# Patient Record
Sex: Female | Born: 1975 | Race: White | Hispanic: No | Marital: Married | Smoking: Current every day smoker
Health system: Southern US, Community
[De-identification: ages and names within clinical notes are randomized; demographics above are authoritative.]

## PROBLEM LIST (undated history)

## (undated) DIAGNOSIS — F32A Depression, unspecified: Secondary | ICD-10-CM

## (undated) DIAGNOSIS — R6882 Decreased libido: Secondary | ICD-10-CM

## (undated) DIAGNOSIS — IMO0002 Reserved for concepts with insufficient information to code with codable children: Secondary | ICD-10-CM

## (undated) DIAGNOSIS — E039 Hypothyroidism, unspecified: Secondary | ICD-10-CM

## (undated) DIAGNOSIS — N200 Calculus of kidney: Secondary | ICD-10-CM

## (undated) DIAGNOSIS — M549 Dorsalgia, unspecified: Secondary | ICD-10-CM

## (undated) DIAGNOSIS — I219 Acute myocardial infarction, unspecified: Secondary | ICD-10-CM

## (undated) DIAGNOSIS — E079 Disorder of thyroid, unspecified: Secondary | ICD-10-CM

## (undated) DIAGNOSIS — N920 Excessive and frequent menstruation with regular cycle: Secondary | ICD-10-CM

## (undated) DIAGNOSIS — O00109 Unspecified tubal pregnancy without intrauterine pregnancy: Secondary | ICD-10-CM

## (undated) DIAGNOSIS — F419 Anxiety disorder, unspecified: Secondary | ICD-10-CM

## (undated) DIAGNOSIS — G8929 Other chronic pain: Secondary | ICD-10-CM

## (undated) DIAGNOSIS — F329 Major depressive disorder, single episode, unspecified: Secondary | ICD-10-CM

## (undated) HISTORY — DX: Unspecified tubal pregnancy without intrauterine pregnancy: O00.109

## (undated) HISTORY — DX: Reserved for concepts with insufficient information to code with codable children: IMO0002

## (undated) HISTORY — PX: ECTOPIC PREGNANCY SURGERY: SHX613

## (undated) HISTORY — DX: Excessive and frequent menstruation with regular cycle: N92.0

## (undated) HISTORY — DX: Calculus of kidney: N20.0

## (undated) HISTORY — DX: Depression, unspecified: F32.A

## (undated) HISTORY — DX: Decreased libido: R68.82

## (undated) HISTORY — DX: Disorder of thyroid, unspecified: E07.9

## (undated) HISTORY — DX: Hypothyroidism, unspecified: E03.9

## (undated) HISTORY — DX: Dorsalgia, unspecified: M54.9

## (undated) HISTORY — DX: Other chronic pain: G89.29

## (undated) HISTORY — DX: Major depressive disorder, single episode, unspecified: F32.9

## (undated) HISTORY — PX: BACK SURGERY: SHX140

---

## 2000-08-05 ENCOUNTER — Other Ambulatory Visit: Admission: RE | Admit: 2000-08-05 | Discharge: 2000-08-05 | Payer: Self-pay | Admitting: *Deleted

## 2000-12-03 ENCOUNTER — Encounter: Payer: Self-pay | Admitting: Family Medicine

## 2000-12-03 ENCOUNTER — Ambulatory Visit (HOSPITAL_COMMUNITY): Admission: RE | Admit: 2000-12-03 | Discharge: 2000-12-03 | Payer: Self-pay | Admitting: Family Medicine

## 2002-01-20 HISTORY — PX: CHOLECYSTECTOMY: SHX55

## 2004-02-12 ENCOUNTER — Ambulatory Visit (HOSPITAL_COMMUNITY): Admission: RE | Admit: 2004-02-12 | Discharge: 2004-02-12 | Payer: Self-pay | Admitting: *Deleted

## 2005-08-21 ENCOUNTER — Ambulatory Visit (HOSPITAL_COMMUNITY): Admission: RE | Admit: 2005-08-21 | Discharge: 2005-08-21 | Payer: Self-pay | Admitting: Family Medicine

## 2006-02-08 ENCOUNTER — Emergency Department (HOSPITAL_COMMUNITY): Admission: EM | Admit: 2006-02-08 | Discharge: 2006-02-08 | Payer: Self-pay | Admitting: Emergency Medicine

## 2007-02-11 ENCOUNTER — Ambulatory Visit (HOSPITAL_COMMUNITY): Admission: RE | Admit: 2007-02-11 | Discharge: 2007-02-11 | Payer: Self-pay | Admitting: General Surgery

## 2007-02-22 ENCOUNTER — Encounter (INDEPENDENT_AMBULATORY_CARE_PROVIDER_SITE_OTHER): Payer: Self-pay | Admitting: General Surgery

## 2007-02-22 ENCOUNTER — Ambulatory Visit (HOSPITAL_COMMUNITY): Admission: RE | Admit: 2007-02-22 | Discharge: 2007-02-22 | Payer: Self-pay | Admitting: General Surgery

## 2007-12-21 ENCOUNTER — Emergency Department (HOSPITAL_COMMUNITY): Admission: EM | Admit: 2007-12-21 | Discharge: 2007-12-22 | Payer: Self-pay | Admitting: Emergency Medicine

## 2007-12-28 ENCOUNTER — Ambulatory Visit (HOSPITAL_COMMUNITY): Admission: RE | Admit: 2007-12-28 | Discharge: 2007-12-28 | Payer: Self-pay | Admitting: Family Medicine

## 2008-01-06 ENCOUNTER — Encounter: Admission: RE | Admit: 2008-01-06 | Discharge: 2008-01-06 | Payer: Self-pay | Admitting: Family Medicine

## 2008-01-20 ENCOUNTER — Encounter: Admission: RE | Admit: 2008-01-20 | Discharge: 2008-01-20 | Payer: Self-pay | Admitting: Family Medicine

## 2008-10-31 ENCOUNTER — Emergency Department (HOSPITAL_COMMUNITY): Admission: EM | Admit: 2008-10-31 | Discharge: 2008-10-31 | Payer: Self-pay | Admitting: Emergency Medicine

## 2009-12-15 IMAGING — CT CT L SPINE W/ CM
1 of 8 series · 3 of 14 positions shown, 4 images · IV contrast (omnipaque)
Comparison: None available.

CLINICAL DATA: 31-year-old female with a palpable mass or area of concern involving her left lower back.  Left paraspinal mass.    
LUMBAR SPINE CT WITH CONTRAST:
TECHNIQUE: Multidetector CT imaging of the lumbar spine was performed following intravenous contrast administration.  Multiplanar CT image reconstructions were also generated.
Contrast:  100 ml Omnipaque 300.

[Series 2: lumbar spine 3.0 b30s · axial · 0.33mm/px · z∈[-342,-36]mm · 3 of 103 slices shown, 4 images]
[im 1/103  soft-tissue]
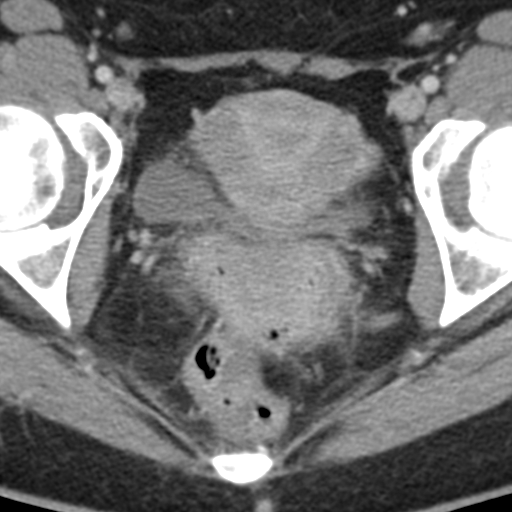
[im 1/103  bone]
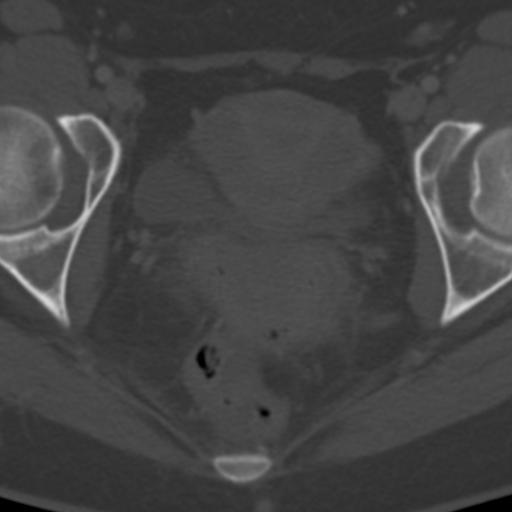
[im 52/103  bone]
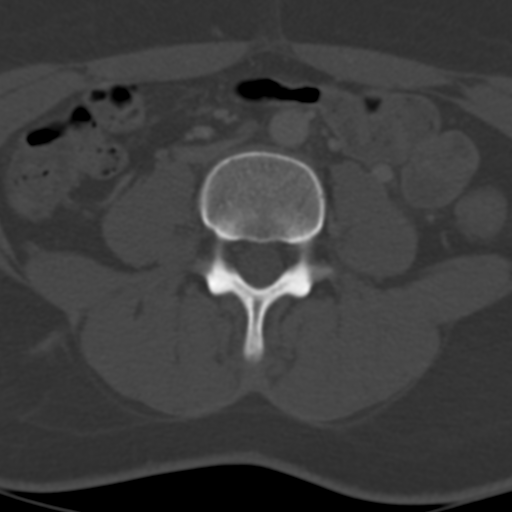
[im 103/103  bone]
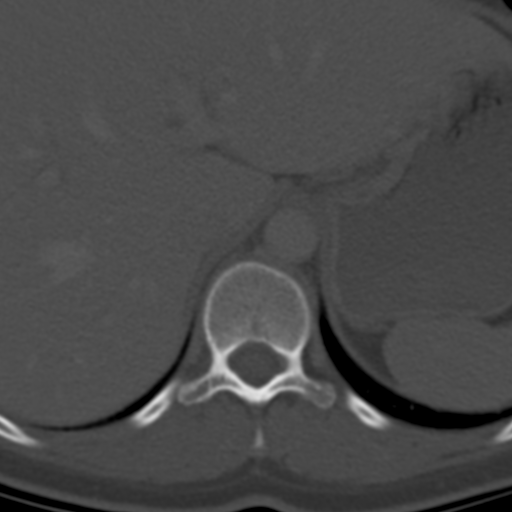

[3 of 14 positions shown; findings below may reference images not displayed]

FINDINGS: There is transitional anatomy with partial lumbarization of S1 and vestigial S1-S2 disc space suspected.  For the purposes of this report, a numbering scheme to reflect this will be utilized.  
The visualized retroperitoneal and abdominal viscera are within normal limits.  There is a circumscribed 10.5 mm cystic structure associated with the right adnexa compatible with physiologic cyst in this age.  No ascites or free fluid is seen. The gallbladder is surgically absent.  No abdominal or pelvic lymphadenopathy is identified. 
There is normal bone mineralization throughout the visualized spine and pelvis.  There is normal vertebral body height and alignment.  
T11-T12:  Normal. 
T12-L1:  Normal. 
L1-L2:  Normal.
L2-L3:  Normal. 
L3-L4:  Normal. 
L4-L5:  Slight disc bulge.  Mild ligament flavum hypertrophy, otherwise normal.  
L5-S1:  Central disc protrusion.  No spinal stenosis (AP thecal sac measures 12 mm).  Mild bilateral neural foraminal stenosis related to combined ligament flavum and disc bulge.  
The visualized paraspinal muscles are unremarkable.  Visualized gluteal and pelvic muscles are unremarkable.  There is a homogeneous fat attenuation mass located just superficial to the posterior left twelfth rib measuring 3.5 X 1.6 X 2.1 cm (transverse X AP X CC).  This appears encapsulated by a small layer of fascia and is most compatible with a simple lipoma.  Otherwise, the visualized skin and subcutaneous soft tissues of the back are unremarkable.
IMPRESSION: 1.  Palpable abnormality may correspond to a lipoma superficial to the posterior twelfth rib measuring 3.5 X 1.6 X 2.1 cm.  
2.  Mild L4-L5 and L5-S1 disc degeneration.

## 2010-04-10 ENCOUNTER — Emergency Department (HOSPITAL_COMMUNITY)
Admission: EM | Admit: 2010-04-10 | Discharge: 2010-04-10 | Disposition: A | Discharge status: Home / Self Care | Payer: Self-pay | Attending: Emergency Medicine | Admitting: Emergency Medicine

## 2010-04-10 ENCOUNTER — Emergency Department (HOSPITAL_COMMUNITY): Payer: Self-pay

## 2010-04-10 DIAGNOSIS — R109 Unspecified abdominal pain: Secondary | ICD-10-CM | POA: Insufficient documentation

## 2010-04-10 DIAGNOSIS — N200 Calculus of kidney: Secondary | ICD-10-CM | POA: Insufficient documentation

## 2010-04-10 DIAGNOSIS — R300 Dysuria: Secondary | ICD-10-CM | POA: Insufficient documentation

## 2010-04-10 LAB — URINALYSIS, ROUTINE W REFLEX MICROSCOPIC
Bilirubin Urine: NEGATIVE
Ketones, ur: NEGATIVE mg/dL
Specific Gravity, Urine: >1.03 — ABNORMAL HIGH (ref 1.005–1.030)
pH: 5 (ref 5.0–8.0)

## 2010-04-10 LAB — PREGNANCY, URINE: Preg Test, Ur: NEGATIVE

## 2010-04-10 LAB — URINE MICROSCOPIC-ADD ON

## 2010-04-15 ENCOUNTER — Emergency Department (HOSPITAL_COMMUNITY)
Admission: EM | Admit: 2010-04-15 | Discharge: 2010-04-16 | Disposition: A | Discharge status: Home / Self Care | Payer: Self-pay | Attending: Emergency Medicine | Admitting: Emergency Medicine

## 2010-04-15 ENCOUNTER — Emergency Department (HOSPITAL_COMMUNITY): Payer: Self-pay

## 2010-04-15 DIAGNOSIS — R079 Chest pain, unspecified: Secondary | ICD-10-CM | POA: Insufficient documentation

## 2010-04-25 LAB — URINALYSIS, ROUTINE W REFLEX MICROSCOPIC
Ketones, ur: NEGATIVE mg/dL
Leukocytes, UA: NEGATIVE
Nitrite: NEGATIVE
pH: 5.5 (ref 5.0–8.0)

## 2010-04-25 LAB — URINE MICROSCOPIC-ADD ON

## 2010-06-04 NOTE — Op Note (Signed)
NAME:  Sheri Vargas, Sheri Vargas                 ACCOUNT NO.:  [id number]   MEDICAL RECORD NO.:  [id number]          PATIENT TYPE:  AMB   LOCATION:  DAY                           FACILITY:  [facility]   PHYSICIAN:  Tilford Pillar, MD      DATE OF BIRTH:  12/09/1975   DATE OF PROCEDURE:  02/22/2007  DATE OF DISCHARGE:                               OPERATIVE REPORT   PREOPERATIVE DIAGNOSIS:  Lipomas of the lumbar region.   POSTPROCEDURE DIAGNOSIS:  Lipomas of the lumbar region.   PROCEDURE:  Excision of lipoma x3.   SURGEON:  Tilford Pillar, MD   ANESTHESIA:  General, local anesthetic with 1% Sensorcaine plain.   SPECIMENS:  Lipomatous masses x3.   ESTIMATED BLOOD LOSS:  Minimal.   INDICATIONS:  The patient is a 35 year old female who presented to my  office with complaints of discomfort in her lower back.  She also  complains of radicular pain and chronic lower back pain.  Also states  that she has discomfort in the area where she can feel the separate  small nodules.  A CT was obtained to ensure that no extension from the  spinal cord was noted as two of these were in the lower lumbar region.  It was discussed with the patient that these were likely benign lipomas.  The risks, benefits, and alternatives of excision were discussed at  length with the patient as well as discussion regarding likely referral  for her radicular pain as this is not likely to be the etiology of her  radiculopathy.  The patient's questions and concerns were addressed.  The patient was consent for the planned procedure.   OPERATION:  The patient was taken to the operating room and placed in  the supine position, at which time a general anesthetic was administered  and once the patient was asleep, she was intubated and then placed in a  right lateral decubitus position using a beanbag device to maintain  positioning.  After ensuring that all pressure points were supported  with soft cushioning, the patient's back was  prepped and draped in the  usual fashion and a marking pen was utilized to mark the planned areas  of incision.  There was one in the superior lumbar region near the T12-  L1 area just palpable below the twelfth rib as well as two in the lower  lumbar region just cephalad to the sacrum.  Two separate vertical  incisions were created.  The superior incision was created first.  Dissection was carried down to the subcuticular tissue using needle-  point electrocautery.  An approximate 1.5-cm lipomatous mass was  identified.  This was excised.  This was not very well-encapsulated and  was multilobulated.  Upon removal, it was placed on the back table and  sent as a permanent specimen.  Hemostasis was obtained.  Palpation of  the upper back at this point did not demonstrate any additional  lipomatous lesions.  A Ray-Tec sponge was placed into the wound and  attention was turned to the lower incision.  Again a vertical skin  incision  was carried out with a scalpel.  Additional dissection down to  the subcuticular was carried out using electrocautery and the two small  multilobulated lipomatous masses were identified.  These were both  approximately 1 cm in length.  These were again removed and were placed  on the back table and sent as a permanent specimen.  The second one of  these was relatively deep, just anterior to the paraspinal fascia.  Both  lesions were clearly within the subcuticular tissue.  No muscle was  divided or entered into.  At this point hemostasis was excellent.  Using  a 3-0 Vicryl, the deep subcuticular tissue was reapproximated at both  incisions in a simple interrupted fashion and a 4-0 Monocryl was  utilized in a running subcuticular tissue to reapproximate the skin  edges of both incisions.  A local anesthetic was instilled and the skin  was washed and dried with a moistened dry towel and Benzoin was applied  around the incision and quarter-inch Steri-Strips were placed  over both  incisions.  The drapes were removed.  The patient was placed back into a  supine position and was allowed to come out of the general anesthetic.  She was transferred to a regular hospital bed and was transferred to the  postanesthetic care unit in stable condition.  At the conclusion of the  procedure all instrument, sponge and needle counts were correct.      Tilford Pillar, MD  Electronically Signed     BZ/MEDQ  D:  02/22/2007  T:  02/22/2007  Job:  [id number]   cc:   Angus G. Renard Matter, MD  Fax: [phone]

## 2010-06-04 NOTE — H&P (Signed)
NAME:  Sheri Vargas, Sheri Vargas                 ACCOUNT NO.:  [id number]   MEDICAL RECORD NO.:  [id number]          PATIENT TYPE:  AMB   LOCATION:  DAY                           FACILITY:  [facility]   PHYSICIAN:  Tilford Pillar, MD      DATE OF BIRTH:  12-Feb-1975   DATE OF ADMISSION:  DATE OF DISCHARGE:  LH                              HISTORY & PHYSICAL   CHIEF COMPLAINT:  Soft tissue masses of the lumbar back.   HISTORY OF PRESENT ILLNESS:  The patient is a 35 year old female who  presents with approximately 4 years of pain, which of course found  mostly to the left but bilateral legs.  She has also noted over the last  several months several nodules along her lumbar back that cause some  discomfort when she pushes on these.  She has not noticed any skin  changes, unknown drainage, no erythema.  She states that she has always  had the leg pain but this seems to have increased slightly with the  increasing size of these nodules.  She does describe her pain as an  electric shock.  It seems to shoot down her leg when the pain flares up.  She denies any history of paresthesias.   PAST MEDICAL HISTORY:  1. Anxiety.  2. Chronic neck pain.   PAST SURGICAL HISTORY:  1. Cholecystectomy.  2. She has had a tubal pregnancy.   CURRENT MEDICATIONS:  1. Lorazepam.  2. Cyclobenzaprine.   ALLERGIES:  NO KNOWN DRUG ALLERGIES.   SOCIAL HISTORY:  She is a 1 pack per day smoker.  No alcohol use.  No  recreational drug use.  She is currently not employed.   REVIEW OF SYSTEMS:  CONSTITUTIONAL:  Occasional headaches.  HEENT:  Eyes  unremarkable.  Ears, nose and throat unremarkable.  RESPIRATORY:  Occasional cough.  CARDIOVASCULAR:  Unremarkable.  GI:  Occasional  abdominal pain.  Occasional nausea.  GENITOURINARY:  Unremarkable.  MUSCULOSKELETAL:  Arthralgias of back and neck.  SKIN:  Unremarkable.  ENDOCRINE:  She denies having energy.  NEURO:  Unremarkable.   PHYSICAL EXAMINATION:  GENERAL:  The patient is a  mildly obese, clearly  anxious female.  HEENT:  No deformities, no masses.  Eyes:  Pupils equal, round and  reactive.  Extraocular movements are intact.  Trachea is mid-line.  No  cervical lymphadenopathy is appreciated.  PULMONARY:  Unlabored respirations.  No wheezing.  No crackles.  She is  clear to auscultation bilaterally.  CARDIOVASCULAR:  Regular rate and rhythm.  No murmurs, gallops or rubs  are appreciated.  Pulses are 2+ in bilateral radial and dorsalis pedis  pulses.  ABDOMEN:  Positive bowel sounds, soft, non-tender.  No hernias, no  masses appreciated.  EXTREMITIES:  No weaknesses.  She has 5/5 plantar flexion, strength and  dorsiflexion on evaluation.  SKIN:  Warm and dry.  On palpation of her back, she does have what  appears to be 3 soft, mobile, smooth nodules in the lower back.  These  are well mobile and do not feel attached to anything.   PERTINENT  RADIOGRAPHIC STUDIES:  Interim CT evaluation of the lumbar  spine demonstrates no association of these soft tissue nodules with the  underlying vertebral column or spinal cord.   ASSESSMENT/PLAN:  Lipoma.  Based on the patient's concern and the slight  increase in discomfort in this area, it is recommended that we do excise  these in the operating room as an outpatient.  In regard to her lower  back pain and radicular pain in her legs, this was discussed as well  with the patient that while it may be somewhat contributed to by the  lipomas pressing on the nerve roots of the vertebrae, it is more likely  that this is related to chronic vertebral disk disease, which is also  apparent on the CT evaluation.  At this time, she does wish to proceed  with resection of the lipomas, and it was discussed with her that should  she continue to have her symptoms that additional evaluation by  orthopedic or neurosurgeon for the disk disease would be required.  She  understands this and again wishes to proceed with the excision of  the  lipomas.      Tilford Pillar, MD  Electronically Signed     BZ/MEDQ  D:  02/16/2007  T:  02/17/2007  Job:  [id number]   cc:   Tilford Pillar, MD  Fax: [phone]   Angus G. Renard Matter, MD  Fax: [phone]   Short Stay Surgery

## 2010-06-07 NOTE — Consult Note (Signed)
NAME:  Sheri Vargas, Sheri Vargas                 ACCOUNT NO.:  [id number]   MEDICAL RECORD NO.:  [id number]          PATIENT TYPE:  EMS   LOCATION:  ED                            FACILITY:  [facility]   PHYSICIAN:  Tilda Burrow, M.D. DATE OF BIRTH:  30-Jul-1975   DATE OF CONSULTATION:  DATE OF DISCHARGE:  02/08/2006                                 CONSULTATION   CONSULT:  Seen in the emergency room to rule out tubal pregnancy.   HISTORY OF PRESENT ILLNESS:  This 35 year old female gravida 5, para 1,  ectopic x3in 1999 was seen in the emergency room with last menstrual  period having been the second week of December.  She has not had any  bleeding.  She presents to the emergency room suspicious ending  pregnancy.   REVIEW OF SYSTEMS:  Notable for dull left lower quadrant ache into the  back without any bleeding or spotting.  She seemed to rule out  pregnancy.   Evaluation in the emergency room includes a quantitative hCG of 2000.  She has had an ultrasound which shows no evidence of intrauterine  pregnancy.  Cannot identify anything abnormal in the adnexa.  There is  specifically no cul-de-sac blood.  There is a question of a corpus  luteum cyst on the left.  The patient's review of systems is notable for  left adnexal pain and in talking to her further she thinks at least two  of the ectopic pregnancies in 1999 where in the left tube.  She may have  had one in the right.  She will bring copies of those records.   PHYSICAL EXAMINATION:  Reveals a generally healthy Caucasian female in  no acute distress, sitting up comfortably complaining of only mild to  minimal left pain behind the left hip.  Again, exam by Dr. Margretta Ditty  shows no bleeding or spotting and no mass-effect appreciable.   PLAN:  The patient is told that we highly suspicion unruptured, early  ectopic pregnancy.  We will see her tomorrow and consider repeating  ultrasound just for our piece of mind and with recommendations that she  either consider laparoscopic removal of the tubal pregnancy and probable  left salpingectomy or methotrexate therapy in the past.  She has had  methotrexate x3.  We will draw liver function tests tonight and blood  type and follow up in the a.m. for repeat ultrasound and this final  decision regarding methotrexate.      Tilda Burrow, M.D.  Electronically Signed     JVF/MEDQ  D:  02/08/2006  T:  02/08/2006  Job:  [id number]   cc:   [facility]  [phone] fax

## 2010-10-10 LAB — BASIC METABOLIC PANEL
BUN: 12
Calcium: 9.5
Creatinine, Ser: 0.8
Potassium: 3.5

## 2010-10-10 LAB — CBC
HCT: 38.4
Hemoglobin: 13.3
MCHC: 34.6
MCV: 97.3
Platelets: 337
RDW: 13.4

## 2010-10-25 LAB — DIFFERENTIAL
Basophils Relative: 0 % (ref 0–1)
Eosinophils Relative: 1 % (ref 0–5)
Lymphs Abs: 3.9 10*3/uL (ref 0.7–4.0)
Monocytes Absolute: 0.7 10*3/uL (ref 0.1–1.0)

## 2010-10-25 LAB — COMPREHENSIVE METABOLIC PANEL
Albumin: 3.7 g/dL (ref 3.5–5.2)
BUN: 11 mg/dL (ref 6–23)
CO2: 23 mEq/L (ref 19–32)
Calcium: 9.3 mg/dL (ref 8.4–10.5)
Creatinine, Ser: 0.57 mg/dL (ref 0.4–1.2)
GFR calc Af Amer: >60 mL/min (ref >60)
GFR calc non Af Amer: >60 mL/min (ref >60)
Glucose, Bld: 100 mg/dL — ABNORMAL HIGH (ref 70–99)
Sodium: 140 mEq/L (ref 135–145)
Total Protein: 5.9 g/dL — ABNORMAL LOW (ref 6.0–8.3)

## 2010-10-25 LAB — CBC
HCT: 37.6 % (ref 36.0–46.0)
Hemoglobin: 12.7 g/dL (ref 12.0–15.0)
MCHC: 33.7 g/dL (ref 30.0–36.0)
MCV: 99.1 fL (ref 78.0–100.0)
RBC: 3.8 MIL/uL — ABNORMAL LOW (ref 3.87–5.11)
RDW: 13.3 % (ref 11.5–15.5)

## 2010-10-25 LAB — URINALYSIS, ROUTINE W REFLEX MICROSCOPIC
Ketones, ur: NEGATIVE mg/dL
Urobilinogen, UA: 0.2 mg/dL (ref 0.0–1.0)
pH: 6 (ref 5.0–8.0)

## 2010-11-18 ENCOUNTER — Encounter: Payer: Self-pay | Admitting: Internal Medicine

## 2011-03-03 ENCOUNTER — Emergency Department (HOSPITAL_COMMUNITY): Payer: Self-pay

## 2011-03-03 ENCOUNTER — Encounter (HOSPITAL_COMMUNITY): Payer: Self-pay

## 2011-03-03 ENCOUNTER — Other Ambulatory Visit: Payer: Self-pay

## 2011-03-03 ENCOUNTER — Emergency Department (HOSPITAL_COMMUNITY)
Admission: EM | Admit: 2011-03-03 | Discharge: 2011-03-03 | Disposition: A | Discharge status: Home / Self Care | Payer: Self-pay | Attending: Emergency Medicine | Admitting: Emergency Medicine

## 2011-03-03 DIAGNOSIS — J449 Chronic obstructive pulmonary disease, unspecified: Secondary | ICD-10-CM | POA: Insufficient documentation

## 2011-03-03 DIAGNOSIS — Z87891 Personal history of nicotine dependence: Secondary | ICD-10-CM | POA: Insufficient documentation

## 2011-03-03 DIAGNOSIS — J4489 Other specified chronic obstructive pulmonary disease: Secondary | ICD-10-CM | POA: Insufficient documentation

## 2011-03-03 DIAGNOSIS — R0789 Other chest pain: Secondary | ICD-10-CM

## 2011-03-03 DIAGNOSIS — Z9851 Tubal ligation status: Secondary | ICD-10-CM | POA: Insufficient documentation

## 2011-03-03 DIAGNOSIS — Z96649 Presence of unspecified artificial hip joint: Secondary | ICD-10-CM | POA: Insufficient documentation

## 2011-03-03 DIAGNOSIS — R51 Headache: Secondary | ICD-10-CM | POA: Insufficient documentation

## 2011-03-03 DIAGNOSIS — R071 Chest pain on breathing: Secondary | ICD-10-CM | POA: Insufficient documentation

## 2011-03-03 LAB — COMPREHENSIVE METABOLIC PANEL
ALT: 20 U/L (ref 0–35)
Alkaline Phosphatase: 41 U/L (ref 39–117)
BUN: 15 mg/dL (ref 6–23)
CO2: 24 mEq/L (ref 19–32)
Chloride: 106 mEq/L (ref 96–112)
GFR calc Af Amer: >90 mL/min (ref >90)
Glucose, Bld: 97 mg/dL (ref 70–99)
Potassium: 3.4 mEq/L — ABNORMAL LOW (ref 3.5–5.1)
Sodium: 140 mEq/L (ref 135–145)
Total Bilirubin: 0.2 mg/dL — ABNORMAL LOW (ref 0.3–1.2)
Total Protein: 6.8 g/dL (ref 6.0–8.3)

## 2011-03-03 LAB — DIFFERENTIAL
Eosinophils Absolute: 0.2 10*3/uL (ref 0.0–0.7)
Lymphocytes Relative: 29 % (ref 12–46)
Lymphs Abs: 3.4 10*3/uL (ref 0.7–4.0)
Monocytes Relative: 8 % (ref 3–12)
Neutro Abs: 7.3 10*3/uL (ref 1.7–7.7)
Neutrophils Relative %: 61 % (ref 43–77)

## 2011-03-03 LAB — CBC
Hemoglobin: 13.5 g/dL (ref 12.0–15.0)
MCH: 32 pg (ref 26.0–34.0)
Platelets: 358 10*3/uL (ref 150–400)
RBC: 4.22 MIL/uL (ref 3.87–5.11)
WBC: 11.9 10*3/uL — ABNORMAL HIGH (ref 4.0–10.5)

## 2011-03-03 MED ORDER — HYDROMORPHONE HCL PF 1 MG/ML IJ SOLN
1.0000 mg | Freq: Once | INTRAMUSCULAR | Status: AC
  Administered 2011-03-03: 1 mg via INTRAVENOUS
  Filled 2011-03-03: qty 1

## 2011-03-03 MED ORDER — OXYCODONE-ACETAMINOPHEN 5-325 MG PO TABS
1.0000 | ORAL_TABLET | Freq: Once | ORAL | Status: AC
  Administered 2011-03-03: 1 via ORAL
  Filled 2011-03-03: qty 1

## 2011-03-03 MED ORDER — OXYCODONE-ACETAMINOPHEN 5-325 MG PO TABS: 1.0000 | ORAL_TABLET | Freq: Four times a day (QID) | ORAL | Status: AC | PRN

## 2011-03-03 NOTE — ED Provider Notes (Signed)
History     CSN: [id number]  Arrival date & time 03/03/11  1311   First MD Initiated Contact with Patient 03/03/11 1337      Chief Complaint  Patient presents with  . Chest Pain  . Headache    (Consider location/radiation/quality/duration/timing/severity/associated sxs/prior treatment) Patient is a 36 y.o. female presenting with chest pain and headaches. The history is provided by the patient (the patient complains of chest pain worse with moving patient also complains of headache and sore and aching all over. No fever no chills). No language interpreter was used.  Chest Pain The chest pain began yesterday. Chest pain occurs constantly. The chest pain is unchanged. The pain is associated with lifting. At its most intense, the pain is at 4/10. The pain is currently at 3/10. The quality of the pain is described as aching. The pain does not radiate. Chest pain is worsened by certain positions. Pertinent negatives for primary symptoms include no fever, no fatigue, no cough and no abdominal pain. She tried nothing for the symptoms. Risk factors include no known risk factors.  Pertinent negatives for past medical history include no aneurysm and no seizures.  Procedure history is negative for cardiac catheterization.    Headache  Pertinent negatives include no fever.    Past Medical History  Diagnosis Date  . COPD (chronic obstructive pulmonary disease)     Past Surgical History  Procedure Date  . Total hip arthroplasty     left x 2   . Cholecystectomy 2004  . Ankle fusion 2011  . Tubal ligation 2008    No family history on file.  History  Substance Use Topics  . Smoking status: Former Smoker -- 1.0 packs/day for 13 years    Types: Cigarettes    Quit date: 01/20/1994  . Smokeless tobacco: Never Used  . Alcohol Use: No    OB History    Grav Para Term Preterm Abortions TAB SAB Ect Mult Living                  Review of Systems  Constitutional: Negative for fever and  fatigue.  HENT: Negative for congestion, sinus pressure and ear discharge.   Eyes: Negative for discharge.  Respiratory: Negative for cough.   Cardiovascular: Positive for chest pain.  Gastrointestinal: Negative for abdominal pain and diarrhea.  Genitourinary: Negative for frequency and hematuria.  Musculoskeletal: Negative for back pain.  Skin: Negative for rash.  Neurological: Positive for headaches. Negative for seizures.  Hematological: Negative.   Psychiatric/Behavioral: Negative for hallucinations.    Allergies  Review of patient's allergies indicates no known allergies.  Home Medications   Current Outpatient Rx  Name Route Sig Dispense Refill  . ACETAMINOPHEN 500 MG PO TABS Oral Take 1,000 mg by mouth every 6 (six) hours as needed. For pain    . VITAMIN D PO Oral Take 1 tablet by mouth daily.    Marland Kitchen DIPHENHYDRAMINE HCL 25 MG PO TABS Oral Take 25 mg by mouth every 6 (six) hours as needed. For allergies    . FLUOXETINE HCL 20 MG PO CAPS Oral Take 20 mg by mouth 2 (two) times daily.    . IBUPROFEN 200 MG PO TABS Oral Take 800 mg by mouth every 6 (six) hours as needed. For pain    . LORAZEPAM 1 MG PO TABS Oral Take 1 mg by mouth every 8 (eight) hours. For anxiety    . OXYCODONE-ACETAMINOPHEN 5-325 MG PO TABS Oral Take 1 tablet by mouth  every 6 (six) hours as needed for pain. 20 tablet 0    BP 96/56  Pulse 68  Temp(Src) 98.4 F (36.9 C) (Oral)  Resp 18  Ht 5\' 3"  (1.6 m)  Wt 137 lb (62.143 kg)  BMI 24.27 kg/m2  SpO2 97%  LMP 02/24/2011  Physical Exam  Constitutional: She is oriented to person, place, and time. She appears well-developed.  HENT:  Head: Normocephalic and atraumatic.  Eyes: Conjunctivae and EOM are normal. No scleral icterus.  Neck: Neck supple. No thyromegaly present.  Cardiovascular: Normal rate and regular rhythm.  Exam reveals no gallop and no friction rub.   No murmur heard. Pulmonary/Chest: No stridor. She has no wheezes. She has no rales. She  exhibits tenderness.       Tender right and left upper chest  Abdominal: She exhibits no distension. There is no tenderness. There is no rebound.  Musculoskeletal: Normal range of motion. She exhibits no edema.  Lymphadenopathy:    She has no cervical adenopathy.  Neurological: She is oriented to person, place, and time. Coordination normal.  Skin: No rash noted. No erythema.  Psychiatric: She has a normal mood and affect. Her behavior is normal.    ED Course  Procedures (including critical care time)  Labs Reviewed  CBC - Abnormal; Notable for the following:    WBC 11.9 (*)    All other components within normal limits  COMPREHENSIVE METABOLIC PANEL - Abnormal; Notable for the following:    Potassium 3.4 (*)    Total Bilirubin 0.2 (*)    All other components within normal limits  DIFFERENTIAL   Dg Chest 2 View  03/03/2011  *RADIOLOGY REPORT*  Clinical Data: Chest pain and headache.  History of smoking.  CHEST - 2 VIEW  Comparison: 04/15/2010  Findings: Cardiomediastinal silhouette is within limits.  The lungs are free of focal consolidations and pleural effusions.  No edema. Visualized osseous structures have a normal appearance.  Surgical clips are identified in the right upper quadrant of the abdomen.  IMPRESSION: No evidence for acute cardiopulmonary abnormality.  Original Report Authenticated By: Patterson Hammersmith, M.D.     1. Chest wall pain      Date: 03/03/2011  Rate: 77  Rhythm: normal sinus rhythm  QRS Axis: normal  Intervals: normal  ST/T Wave abnormalities: normal  Conduction Disutrbances:none  Narrative Interpretation:   Old EKG Reviewed: unchanged    MDM  Chest wall pain         Benny Lennert, MD 03/03/11 1739

## 2011-03-03 NOTE — ED Notes (Signed)
C/o upper left chest tightness along with headache and body aches per pt. Pain worse with inspiration. Denies sob, n,v nad noted.

## 2011-08-25 ENCOUNTER — Emergency Department (HOSPITAL_COMMUNITY)
Admission: EM | Admit: 2011-08-25 | Discharge: 2011-08-25 | Disposition: A | Discharge status: Home / Self Care | Payer: Self-pay | Attending: Emergency Medicine | Admitting: Emergency Medicine

## 2011-08-25 ENCOUNTER — Encounter (HOSPITAL_COMMUNITY): Payer: Self-pay | Admitting: Emergency Medicine

## 2011-08-25 DIAGNOSIS — F411 Generalized anxiety disorder: Secondary | ICD-10-CM | POA: Insufficient documentation

## 2011-08-25 DIAGNOSIS — R0789 Other chest pain: Secondary | ICD-10-CM

## 2011-08-25 DIAGNOSIS — F17200 Nicotine dependence, unspecified, uncomplicated: Secondary | ICD-10-CM | POA: Insufficient documentation

## 2011-08-25 DIAGNOSIS — Z79899 Other long term (current) drug therapy: Secondary | ICD-10-CM | POA: Insufficient documentation

## 2011-08-25 DIAGNOSIS — R079 Chest pain, unspecified: Secondary | ICD-10-CM | POA: Insufficient documentation

## 2011-08-25 HISTORY — DX: Anxiety disorder, unspecified: F41.9

## 2011-08-25 MED ORDER — IBUPROFEN 800 MG PO TABS
800.0000 mg | ORAL_TABLET | Freq: Once | ORAL | Status: AC
  Administered 2011-08-25: 800 mg via ORAL
  Filled 2011-08-25: qty 1

## 2011-08-25 NOTE — ED Provider Notes (Signed)
History     CSN: [id number]  Arrival date & time 08/25/11  0316   First MD Initiated Contact with Patient 08/25/11 [phone]      Chief Complaint  Patient presents with  . Chest Pain    (Consider location/radiation/quality/duration/timing/severity/associated sxs/prior treatment) HPI MIO SCHELLINGER is a 36 y.o. female who presents to the Emergency Department complaining of sharp chest pain to the right side of her chest that woke her from sleep. Pain is worse with movement of her right arm and with deep breathing. Denies fever, chills, cough, shortness of breath.     Past Medical History  Diagnosis Date  . Anxiety     Past Surgical History  Procedure Date  . Total hip arthroplasty     left x 2   . Cholecystectomy 2004  . Ankle fusion 2011  . Tubal ligation 2008    History reviewed. No pertinent family history.  History  Substance Use Topics  . Smoking status: Current Everyday Smoker -- 1.0 packs/day for 13 years    Types: Cigarettes    Last Attempt to Quit: 01/20/1994  . Smokeless tobacco: Never Used  . Alcohol Use: No    OB History    Grav Para Term Preterm Abortions TAB SAB Ect Mult Living                  Review of Systems  Constitutional: Negative for fever.       10 Systems reviewed and are negative for acute change except as noted in the HPI.  HENT: Negative for congestion.   Eyes: Negative for discharge and redness.  Respiratory: Negative for cough and shortness of breath.   Cardiovascular: Positive for chest pain.  Gastrointestinal: Negative for vomiting and abdominal pain.  Musculoskeletal: Negative for back pain.  Skin: Negative for rash.  Neurological: Negative for syncope, numbness and headaches.  Psychiatric/Behavioral:       No behavior change.    Allergies  Review of patient's allergies indicates no known allergies.  Home Medications   Current Outpatient Rx  Name Route Sig Dispense Refill  . ACETAMINOPHEN 500 MG PO TABS Oral Take  1,000 mg by mouth every 6 (six) hours as needed. For pain    . VITAMIN D PO Oral Take 1 tablet by mouth daily.    Marland Kitchen DIPHENHYDRAMINE HCL 25 MG PO TABS Oral Take 25 mg by mouth every 6 (six) hours as needed. For allergies    . FLUOXETINE HCL 20 MG PO CAPS Oral Take 20 mg by mouth 2 (two) times daily.    . IBUPROFEN 200 MG PO TABS Oral Take 800 mg by mouth every 6 (six) hours as needed. For pain    . LORAZEPAM 1 MG PO TABS Oral Take 1 mg by mouth every 8 (eight) hours. For anxiety      BP 127/84  Pulse 79  Temp 98.1 F (36.7 C) (Oral)  Resp 14  Ht 5\' 3"  (1.6 m)  Wt 138 lb (62.596 kg)  BMI 24.45 kg/m2  SpO2 97%  LMP 08/04/2011  Physical Exam  Nursing note and vitals reviewed. Constitutional: She is oriented to person, place, and time.       Awake, alert, nontoxic appearance.  HENT:  Head: Atraumatic.  Eyes: Right eye exhibits no discharge. Left eye exhibits no discharge.  Neck: Neck supple.  Cardiovascular: Normal heart sounds.   Pulmonary/Chest: Effort normal and breath sounds normal. No respiratory distress. She has no wheezes. She has no rales. She  exhibits tenderness.       Mild right sided chest tenderness with palpation.  Abdominal: Soft. There is no tenderness. There is no rebound.  Genitourinary:       No CVA tenderness  Musculoskeletal: She exhibits no tenderness.       Baseline ROM, no obvious new focal weakness.  Neurological: She is alert and oriented to person, place, and time.       Mental status and motor strength appears baseline for patient and situation.  Skin: Skin is warm and dry. No rash noted.  Psychiatric: She has a normal mood and affect.    ED Course  Procedures (including critical care time)   Date: 08/25/2011   0330  Rate: 76  Rhythm: normal sinus rhythm with occasional PVC  QRS Axis: normal  Intervals: normal  ST/T Wave abnormalities: normal  Conduction Disutrbances: none  Narrative Interpretation: unremarkable   0342 Patient remembered  she had mopped the floor at her mother's home two days ago.  MDM  Patient with sharp pain to the right chest awakening her. Reproducible with palpation. EKG is normal. No further cardiac work up was pursued. Given ibuprofen. Pt stable in ED with no significant deterioration in condition.The patient appears reasonably screened and/or stabilized for discharge and I doubt any other medical condition or other [facility] requiring further screening, evaluation, or treatment in the ED at this time prior to discharge.  MDM Reviewed: nursing note and vitals Interpretation: ECG           Nicoletta Dress. Colon Branch, MD 08/25/11 0981

## 2011-08-25 NOTE — ED Notes (Addendum)
Locates pain over sternum and to the right of the sternum at her breast.  Describes as sharp, severe pain lasted only a second or so, now feels heaviness and pressure on her chest.  Denies pain with inspiration.  Also c/o right arm feeling a "little numb" during the pain but not at this time.  Denies and nausea or vomiting.

## 2011-08-25 NOTE — ED Notes (Signed)
Patient complaining of sharp chest pain waking her up approximately 2 hours ago.

## 2012-06-17 ENCOUNTER — Encounter: Payer: Self-pay | Admitting: Adult Health

## 2012-06-17 ENCOUNTER — Ambulatory Visit (INDEPENDENT_AMBULATORY_CARE_PROVIDER_SITE_OTHER): Payer: 59 | Admitting: Adult Health

## 2012-06-17 ENCOUNTER — Other Ambulatory Visit (HOSPITAL_COMMUNITY)
Admission: RE | Admit: 2012-06-17 | Discharge: 2012-06-17 | Disposition: A | Discharge status: Home / Self Care | Payer: 59 | Source: Ambulatory Visit | Attending: Adult Health | Admitting: Adult Health

## 2012-06-17 VITALS — BP 120/70 | HR 80 | Ht 62.0 in | Wt 159.0 lb

## 2012-06-17 DIAGNOSIS — Z1151 Encounter for screening for human papillomavirus (HPV): Secondary | ICD-10-CM | POA: Insufficient documentation

## 2012-06-17 DIAGNOSIS — R6882 Decreased libido: Secondary | ICD-10-CM | POA: Insufficient documentation

## 2012-06-17 DIAGNOSIS — G8929 Other chronic pain: Secondary | ICD-10-CM

## 2012-06-17 DIAGNOSIS — N920 Excessive and frequent menstruation with regular cycle: Secondary | ICD-10-CM

## 2012-06-17 DIAGNOSIS — Z01419 Encounter for gynecological examination (general) (routine) without abnormal findings: Secondary | ICD-10-CM

## 2012-06-17 DIAGNOSIS — F419 Anxiety disorder, unspecified: Secondary | ICD-10-CM

## 2012-06-17 DIAGNOSIS — K5900 Constipation, unspecified: Secondary | ICD-10-CM

## 2012-06-17 DIAGNOSIS — N852 Hypertrophy of uterus: Secondary | ICD-10-CM | POA: Insufficient documentation

## 2012-06-17 HISTORY — DX: Excessive and frequent menstruation with regular cycle: N92.0

## 2012-06-17 HISTORY — DX: Other chronic pain: G89.29

## 2012-06-17 HISTORY — DX: Decreased libido: R68.82

## 2012-06-17 LAB — COMPREHENSIVE METABOLIC PANEL
ALT: 13 U/L (ref 0–35)
Alkaline Phosphatase: 47 U/L (ref 39–117)
CO2: 25 mEq/L (ref 19–32)
Creat: 0.6 mg/dL (ref 0.50–1.10)
Glucose, Bld: 85 mg/dL (ref 70–99)
Total Bilirubin: 0.2 mg/dL — ABNORMAL LOW (ref 0.3–1.2)

## 2012-06-17 LAB — FOLLICLE STIMULATING HORMONE: FSH: 11.5 m[IU]/mL

## 2012-06-17 LAB — CBC
MCH: 30.6 pg (ref 26.0–34.0)
MCHC: 33.9 g/dL (ref 30.0–36.0)
MCV: 90.4 fL (ref 78.0–100.0)
Platelets: 371 10*3/uL (ref 150–400)
RBC: 3.85 MIL/uL — ABNORMAL LOW (ref 3.87–5.11)

## 2012-06-17 NOTE — Patient Instructions (Addendum)
Menorrhagia Dysfunctional uterine bleeding is different from a normal menstrual period. When periods are heavy or there is more bleeding than is usual for you, it is called menorrhagia. It may be caused by hormonal imbalance, or physical, metabolic, or other problems. Examination is necessary in order that your caregiver may treat treatable causes. If this is a continuing problem, a D&C may be needed. That means that the cervix (the opening of the uterus or womb) is dilated (stretched larger) and the lining of the uterus is scraped out. The tissue scraped out is then examined under a microscope by a specialist (pathologist) to make sure there is nothing of concern that needs further or more extensive treatment. HOME CARE INSTRUCTIONS   If medications were prescribed, take exactly as directed. Do not change or switch medications without consulting your caregiver.  Long term heavy bleeding may result in iron deficiency. Your caregiver may have prescribed iron pills. They help replace the iron your body lost from heavy bleeding. Take exactly as directed. Iron may cause constipation. If this becomes a problem, increase the bran, fruits, and roughage in your diet.  Do not take aspirin or medicines that contain aspirin one week before or during your menstrual period. Aspirin may make the bleeding worse.  If you need to change your sanitary pad or tampon more than once every 2 hours, stay in bed and rest as much as possible until the bleeding stops.  Eat well-balanced meals. Eat foods high in iron. Examples are leafy green vegetables, meat, liver, eggs, and whole grain breads and cereals. Do not try to lose weight until the abnormal bleeding has stopped and your blood iron level is back to normal. SEEK MEDICAL CARE IF:   You need to change your sanitary pad or tampon more than once an hour.  You develop nausea (feeling sick to your stomach) and vomiting, dizziness, or diarrhea while you are taking your  medicine.  You have any problems that may be related to the medicine you are taking. SEEK IMMEDIATE MEDICAL CARE IF:   You have a fever.  You develop chills.  You develop severe bleeding or start to pass blood clots.  You feel dizzy or faint. MAKE SURE YOU:   Understand these instructions.  Will watch your condition.  Will get help right away if you are not doing well or get worse. Document Released: 01/06/2005 Document Revised: 03/31/2011 Document Reviewed: 08/27/2007 [facility] Patient Information 2014 [place], [place]. Come back 1 week for [place] Call Monday for labs

## 2012-06-17 NOTE — Progress Notes (Signed)
Patient ID: Sheri Vargas, female   DOB: 03-Dec-1975, 37 y.o.   MRN: [id number] History of Present Illness: Sheri Vargas is a 37 year old white female in for pap and physical and complains of decrease libido x 1 year. She works 3rd shift.  Current Medications, Allergies, Past Medical History, Past Surgical History, Family History and Social History were reviewed in [employer] record.    Review of Systems: Patient denies any  blurred vision, shortness of breath, chest pain, abdominal pain,She has constipation, she goes about every 3 days and it is hard, she has decrease sex drive and sometimes it is hard to pee.She has chronic back pain and is on pain meds.No joint pain or swelling, she is moody, but is on prozac and ativan.She says her periods are heavy the first few days and she changes pads about 7-8 times.  Physical Exam:Blood pressure 120/70, pulse 80, height 5\' 2"  (1.575 m), weight 159 lb (72.122 kg), last menstrual period 06/11/2012. General:  Well developed, well nourished, no acute distress Skin:  Warm and dry Neck:  Midline trachea, normal thyroid Lungs; Clear to auscultation bilaterally Breast:  No dominant palpable mass, retraction, or nipple discharge Cardiovascular: Regular rate and rhythm Abdomen:  Soft, non tender, no hepatosplenomegaly Pelvic:  External genitalia is normal in appearance.  The vagina is normal in appearance, except for period like blood. The cervix is bulbous.Pap performed with HPV.  Uterus is felt to be enlarged with ? Anterior fibroid.  No adnexal masses or tenderness noted. Extremities:  No swelling or varicosities noted Psych:  Somber, cooperative  Impression: Yearly Gyn exam Menorrhagia History anxiety Chronic back pain Constipation Uterine enlargement ?fibroid Decreased libido  Plan: Pelvic US in 1 week Check CBC,CMP,TSH,FSH, call Monday for results Try 1/2 cup applesauce,1/2 cup oatmeal and 1/2 cup prune juice  daily Physical in 1 year Mammogram at 39

## 2012-06-17 NOTE — Assessment & Plan Note (Signed)
Will get US and labs 

## 2012-06-25 ENCOUNTER — Ambulatory Visit (INDEPENDENT_AMBULATORY_CARE_PROVIDER_SITE_OTHER): Payer: 59

## 2012-06-25 ENCOUNTER — Ambulatory Visit (INDEPENDENT_AMBULATORY_CARE_PROVIDER_SITE_OTHER): Payer: 59 | Admitting: Adult Health

## 2012-06-25 ENCOUNTER — Encounter: Payer: Self-pay | Admitting: Adult Health

## 2012-06-25 VITALS — BP 110/80 | Ht 63.0 in | Wt 155.5 lb

## 2012-06-25 DIAGNOSIS — R946 Abnormal results of thyroid function studies: Secondary | ICD-10-CM

## 2012-06-25 DIAGNOSIS — N852 Hypertrophy of uterus: Secondary | ICD-10-CM

## 2012-06-25 DIAGNOSIS — N920 Excessive and frequent menstruation with regular cycle: Secondary | ICD-10-CM

## 2012-06-25 DIAGNOSIS — E039 Hypothyroidism, unspecified: Secondary | ICD-10-CM

## 2012-06-25 DIAGNOSIS — R7989 Other specified abnormal findings of blood chemistry: Secondary | ICD-10-CM

## 2012-06-25 HISTORY — DX: Hypothyroidism, unspecified: E03.9

## 2012-06-25 MED ORDER — LEVOTHYROXINE SODIUM 25 MCG PO TABS: 25.0000 ug | ORAL_TABLET | Freq: Every day | ORAL | Status: DC

## 2012-06-25 NOTE — Progress Notes (Signed)
Subjective:     Patient ID: Sheri Vargas, female   DOB: 14-Dec-1975, 37 y.o.   MRN: [id number]  HPI Sheri Vargas is back for an [place] and review labs.  Review of Systems Complains of heavy menses, decrease libido,fatigue, constipation, hair loss Did not try recommended treatment for constipation yet.  Reviewed past medical,surgical, social and family history. Reviewed medications and allergies.     Objective:   Physical Exam BP 110/80  Ht 5\' 3"  (1.6 m)  Wt 155 lb 8 oz (70.534 kg)  BMI 27.55 kg/m2  LMP 06/11/2012    Reviewed [place]: uterus is 9.3x4.4x5.4 cm,endometrium is symmetrical, right ovary  Appears normal with a dominant follicle, left ovary appears normal,no fibroid seen.Pap was normal with negative HPV, on labs TSH was 5.561, FSH 11.5 HGB 11.8 Assessment:      Hypothyroid Elevated TSH Menorrhagia    Plan:      Keep period calendar Rx levothyroxine 25 mcg 1 daily and recheck in 8 weeks.

## 2012-06-25 NOTE — Patient Instructions (Addendum)
Hypothyroidism The thyroid is a large gland located in the lower front of your neck. The thyroid gland helps control metabolism. Metabolism is how your body handles food. It controls metabolism with the hormone thyroxine. When this gland is underactive (hypothyroid), it produces too little hormone.  CAUSES These include:   Absence or destruction of thyroid tissue.  Goiter due to iodine deficiency.  Goiter due to medications.  Congenital defects (since birth).  Problems with the pituitary. This causes a lack of TSH (thyroid stimulating hormone). This hormone tells the thyroid to turn out more hormone. SYMPTOMS  Lethargy (feeling as though you have no energy)  Cold intolerance  Weight gain (in spite of normal food intake)  Dry skin  Coarse hair  Menstrual irregularity (if severe, may lead to infertility)  Slowing of thought processes Cardiac problems are also caused by insufficient amounts of thyroid hormone. Hypothyroidism in the newborn is cretinism, and is an extreme form. It is important that this form be treated adequately and immediately or it will lead rapidly to retarded physical and mental development. DIAGNOSIS  To prove hypothyroidism, your caregiver may do blood tests and ultrasound tests. Sometimes the signs are hidden. It may be necessary for your caregiver to watch this illness with blood tests either before or after diagnosis and treatment. TREATMENT  Low levels of thyroid hormone are increased by using synthetic thyroid hormone. This is a safe, effective treatment. It usually takes about four weeks to gain the full effects of the medication. After you have the full effect of the medication, it will generally take another four weeks for problems to leave. Your caregiver may start you on low doses. If you have had heart problems the dose may be gradually increased. It is generally not an emergency to get rapidly to normal. HOME CARE INSTRUCTIONS   Take your  medications as your caregiver suggests. Let your caregiver know of any medications you are taking or start taking. Your caregiver will help you with dosage schedules.  As your condition improves, your dosage needs may increase. It will be necessary to have continuing blood tests as suggested by your caregiver.  Report all suspected medication side effects to your caregiver. SEEK MEDICAL CARE IF: Seek medical care if you develop:  Sweating.  Tremulousness (tremors).  Anxiety.  Rapid weight loss.  Heat intolerance.  Emotional swings.  Diarrhea.  Weakness. SEEK IMMEDIATE MEDICAL CARE IF:  You develop chest pain, an irregular heart beat (palpitations), or a rapid heart beat. MAKE SURE YOU:   Understand these instructions.  Will watch your condition.  Will get help right away if you are not doing well or get worse. Document Released: 01/06/2005 Document Revised: 03/31/2011 Document Reviewed: 08/27/2007 [facility] Patient Information 2014 [place], [place]. Keep period calendar Start synthroid Return in 8 weeks to review and check labs

## 2012-08-09 ENCOUNTER — Encounter: Payer: Self-pay | Admitting: Adult Health

## 2012-08-10 ENCOUNTER — Ambulatory Visit (INDEPENDENT_AMBULATORY_CARE_PROVIDER_SITE_OTHER): Payer: 59 | Admitting: Adult Health

## 2012-08-10 ENCOUNTER — Encounter: Payer: Self-pay | Admitting: Adult Health

## 2012-08-10 VITALS — BP 100/66 | Ht 63.0 in | Wt 158.0 lb

## 2012-08-10 DIAGNOSIS — J3489 Other specified disorders of nose and nasal sinuses: Secondary | ICD-10-CM

## 2012-08-10 DIAGNOSIS — E039 Hypothyroidism, unspecified: Secondary | ICD-10-CM

## 2012-08-10 DIAGNOSIS — R599 Enlarged lymph nodes, unspecified: Secondary | ICD-10-CM

## 2012-08-10 LAB — T4: T4, Total: 9.1 ug/dL (ref 5.0–12.5)

## 2012-08-10 LAB — TSH: TSH: 4.85 u[IU]/mL — ABNORMAL HIGH (ref 0.350–4.500)

## 2012-08-10 LAB — CBC
MCH: 30.6 pg (ref 26.0–34.0)
MCHC: 33.2 g/dL (ref 30.0–36.0)
Platelets: 364 10*3/uL (ref 150–400)

## 2012-08-10 LAB — T4, FREE: Free T4: 1.06 ng/dL (ref 0.80–1.80)

## 2012-08-10 LAB — T3: T3, Total: 101.5 ng/dL (ref 80.0–204.0)

## 2012-08-10 LAB — T3, FREE: T3, Free: 3.3 pg/mL (ref 2.3–4.2)

## 2012-08-10 NOTE — Patient Instructions (Addendum)
Hypothyroidism The thyroid is a large gland located in the lower front of your neck. The thyroid gland helps control metabolism. Metabolism is how your body handles food. It controls metabolism with the hormone thyroxine. When this gland is underactive (hypothyroid), it produces too little hormone.  CAUSES These include:   Absence or destruction of thyroid tissue.  Goiter due to iodine deficiency.  Goiter due to medications.  Congenital defects (since birth).  Problems with the pituitary. This causes a lack of TSH (thyroid stimulating hormone). This hormone tells the thyroid to turn out more hormone. SYMPTOMS  Lethargy (feeling as though you have no energy)  Cold intolerance  Weight gain (in spite of normal food intake)  Dry skin  Coarse hair  Menstrual irregularity (if severe, may lead to infertility)  Slowing of thought processes Cardiac problems are also caused by insufficient amounts of thyroid hormone. Hypothyroidism in the newborn is cretinism, and is an extreme form. It is important that this form be treated adequately and immediately or it will lead rapidly to retarded physical and mental development. DIAGNOSIS  To prove hypothyroidism, your caregiver may do blood tests and ultrasound tests. Sometimes the signs are hidden. It may be necessary for your caregiver to watch this illness with blood tests either before or after diagnosis and treatment. TREATMENT  Low levels of thyroid hormone are increased by using synthetic thyroid hormone. This is a safe, effective treatment. It usually takes about four weeks to gain the full effects of the medication. After you have the full effect of the medication, it will generally take another four weeks for problems to leave. Your caregiver may start you on low doses. If you have had heart problems the dose may be gradually increased. It is generally not an emergency to get rapidly to normal. HOME CARE INSTRUCTIONS   Take your  medications as your caregiver suggests. Let your caregiver know of any medications you are taking or start taking. Your caregiver will help you with dosage schedules.  As your condition improves, your dosage needs may increase. It will be necessary to have continuing blood tests as suggested by your caregiver.  Report all suspected medication side effects to your caregiver. SEEK MEDICAL CARE IF: Seek medical care if you develop:  Sweating.  Tremulousness (tremors).  Anxiety.  Rapid weight loss.  Heat intolerance.  Emotional swings.  Diarrhea.  Weakness. SEEK IMMEDIATE MEDICAL CARE IF:  You develop chest pain, an irregular heart beat (palpitations), or a rapid heart beat. MAKE SURE YOU:   Understand these instructions.  Will watch your condition.  Will get help right away if you are not doing well or get worse. Document Released: 01/06/2005 Document Revised: 03/31/2011 Document Reviewed: 08/27/2007 [facility] Patient Information 2014 [place], [place]. Follow up labs in  am

## 2012-08-10 NOTE — Progress Notes (Signed)
Subjective:     Patient ID: Sheri Vargas, female   DOB: [birth year], 37 y.o.   MRN: [id number]  HPI Sheri Vargas is in today complaining of body aches, headache and sleepiness,she has missed work.She was started on synthroid 25 mcg the end of May.And she has noticed a swollen lymph node in her right neck.  Review of Systems Positives in HPI Reviewed past medical,surgical, social and family history. Reviewed medications and allergies.     Objective:   Physical Exam BP 100/66  Ht 5\' 3"  (1.6 m)  Wt 158 lb (71.668 kg)  BMI 28 kg/m2  LMP 07/25/2012   skin warm and dry, neck mid line trachea thyroid within normal size, there is a small swollen lymph node in right neck, she says it has gone down in size,there is pressure behind her eyes she says but no tenderness with tapping on sinus cavity, she has some mucous. Assessment:      Hypothyroid Swollen lymph node Sinus pressure    Plan:    Try otc mucinex, or sinus relief drugs Check CBC,TSH and thyroid profile Note given for work to excuse 7/16,7/17,7/21 and 7/22 to return to work 7/23 Call in am for labs

## 2012-08-11 ENCOUNTER — Telehealth: Payer: Self-pay | Admitting: *Deleted

## 2012-08-11 MED ORDER — LEVOTHYROXINE SODIUM 50 MCG PO TABS: 50.0000 ug | ORAL_TABLET | Freq: Every day | ORAL | Status: DC

## 2012-08-11 NOTE — Telephone Encounter (Signed)
Sheri Vargas, please review labs. TSH was a little elevated. Thanks!!!! [employer]

## 2012-08-11 NOTE — Telephone Encounter (Signed)
Pt aware of labs and need ot increase synthroid to 50 mcg, keep appt

## 2012-08-23 ENCOUNTER — Telehealth: Payer: Self-pay | Admitting: *Deleted

## 2012-08-23 NOTE — Telephone Encounter (Signed)
Left message that if its for labs can go longer than 8 weeks but not over 12.

## 2012-08-25 ENCOUNTER — Ambulatory Visit: Payer: 59 | Admitting: Adult Health

## 2012-09-07 ENCOUNTER — Emergency Department (HOSPITAL_COMMUNITY)
Admission: EM | Admit: 2012-09-07 | Discharge: 2012-09-07 | Disposition: A | Discharge status: Home / Self Care | Payer: Self-pay | Attending: Emergency Medicine | Admitting: Emergency Medicine

## 2012-09-07 ENCOUNTER — Encounter (HOSPITAL_COMMUNITY): Payer: Self-pay | Admitting: *Deleted

## 2012-09-07 ENCOUNTER — Emergency Department (HOSPITAL_COMMUNITY): Payer: Self-pay

## 2012-09-07 DIAGNOSIS — Z96649 Presence of unspecified artificial hip joint: Secondary | ICD-10-CM | POA: Insufficient documentation

## 2012-09-07 DIAGNOSIS — F3289 Other specified depressive episodes: Secondary | ICD-10-CM | POA: Insufficient documentation

## 2012-09-07 DIAGNOSIS — R51 Headache: Secondary | ICD-10-CM | POA: Insufficient documentation

## 2012-09-07 DIAGNOSIS — Z3202 Encounter for pregnancy test, result negative: Secondary | ICD-10-CM | POA: Insufficient documentation

## 2012-09-07 DIAGNOSIS — Z8742 Personal history of other diseases of the female genital tract: Secondary | ICD-10-CM | POA: Insufficient documentation

## 2012-09-07 DIAGNOSIS — Z79899 Other long term (current) drug therapy: Secondary | ICD-10-CM | POA: Insufficient documentation

## 2012-09-07 DIAGNOSIS — F419 Anxiety disorder, unspecified: Secondary | ICD-10-CM

## 2012-09-07 DIAGNOSIS — Z8739 Personal history of other diseases of the musculoskeletal system and connective tissue: Secondary | ICD-10-CM | POA: Insufficient documentation

## 2012-09-07 DIAGNOSIS — F17200 Nicotine dependence, unspecified, uncomplicated: Secondary | ICD-10-CM | POA: Insufficient documentation

## 2012-09-07 DIAGNOSIS — Z87442 Personal history of urinary calculi: Secondary | ICD-10-CM | POA: Insufficient documentation

## 2012-09-07 DIAGNOSIS — F411 Generalized anxiety disorder: Secondary | ICD-10-CM | POA: Insufficient documentation

## 2012-09-07 DIAGNOSIS — G8929 Other chronic pain: Secondary | ICD-10-CM | POA: Insufficient documentation

## 2012-09-07 DIAGNOSIS — R0789 Other chest pain: Secondary | ICD-10-CM | POA: Insufficient documentation

## 2012-09-07 DIAGNOSIS — E039 Hypothyroidism, unspecified: Secondary | ICD-10-CM | POA: Insufficient documentation

## 2012-09-07 DIAGNOSIS — Z91040 Latex allergy status: Secondary | ICD-10-CM | POA: Insufficient documentation

## 2012-09-07 DIAGNOSIS — R079 Chest pain, unspecified: Secondary | ICD-10-CM

## 2012-09-07 DIAGNOSIS — R0602 Shortness of breath: Secondary | ICD-10-CM | POA: Insufficient documentation

## 2012-09-07 DIAGNOSIS — F329 Major depressive disorder, single episode, unspecified: Secondary | ICD-10-CM | POA: Insufficient documentation

## 2012-09-07 LAB — COMPREHENSIVE METABOLIC PANEL
AST: 19 U/L (ref 0–37)
Albumin: 3.6 g/dL (ref 3.5–5.2)
Alkaline Phosphatase: 51 U/L (ref 39–117)
Chloride: 103 mEq/L (ref 96–112)
Potassium: 3.8 mEq/L (ref 3.5–5.1)
Sodium: 137 mEq/L (ref 135–145)
Total Bilirubin: 0.2 mg/dL — ABNORMAL LOW (ref 0.3–1.2)
Total Protein: 7.2 g/dL (ref 6.0–8.3)

## 2012-09-07 LAB — CBC
Platelets: 335 10*3/uL (ref 150–400)
RDW: 12.9 % (ref 11.5–15.5)
WBC: 7.7 10*3/uL (ref 4.0–10.5)

## 2012-09-07 MED ORDER — FAMOTIDINE 20 MG PO TABS: 20.0000 mg | ORAL_TABLET | Freq: Two times a day (BID) | ORAL | Status: DC

## 2012-09-07 MED ORDER — HYDROMORPHONE HCL PF 2 MG/ML IJ SOLN
2.0000 mg | Freq: Once | INTRAMUSCULAR | Status: AC
  Administered 2012-09-07: 2 mg via INTRAMUSCULAR
  Filled 2012-09-07: qty 1

## 2012-09-07 MED ORDER — PANTOPRAZOLE SODIUM 40 MG IV SOLR
40.0000 mg | Freq: Once | INTRAVENOUS | Status: AC
  Administered 2012-09-07: 40 mg via INTRAVENOUS
  Filled 2012-09-07: qty 40

## 2012-09-07 MED ORDER — HYDROMORPHONE HCL PF 1 MG/ML IJ SOLN
1.0000 mg | Freq: Once | INTRAMUSCULAR | Status: DC
  Filled 2012-09-07: qty 1

## 2012-09-07 MED ORDER — ONDANSETRON HCL 4 MG/2ML IJ SOLN
4.0000 mg | Freq: Once | INTRAMUSCULAR | Status: DC
  Filled 2012-09-07: qty 2

## 2012-09-07 MED ORDER — SODIUM CHLORIDE 0.9 % IV SOLN: INTRAVENOUS | Status: DC

## 2012-09-07 MED ORDER — SODIUM CHLORIDE 0.9 % IV BOLUS (SEPSIS)
1000.0000 mL | Freq: Once | INTRAVENOUS | Status: AC
  Administered 2012-09-07: 1000 mL via INTRAVENOUS

## 2012-09-07 MED ORDER — HYDROCODONE-ACETAMINOPHEN 5-325 MG PO TABS
ORAL_TABLET | ORAL | Status: AC
  Filled 2012-09-07: qty 1

## 2012-09-07 MED ORDER — LORAZEPAM 1 MG PO TABS: 1.0000 mg | ORAL_TABLET | Freq: Three times a day (TID) | ORAL | Status: DC | PRN

## 2012-09-07 MED ORDER — TRAMADOL HCL 50 MG PO TABS: 50.0000 mg | ORAL_TABLET | Freq: Four times a day (QID) | ORAL | Status: DC | PRN

## 2012-09-07 NOTE — ED Notes (Addendum)
Reasoning for holding pain medications explained to patient, pleads with nursing to administer pain medication.  The patient is in no acute distress at this time.  Dr. Deretha Emory made aware.

## 2012-09-07 NOTE — ED Notes (Signed)
Continuing to hold narcotic pain medication due to hypotension.

## 2012-09-07 NOTE — ED Notes (Signed)
Ambulatory to restroom with steady gait, now requesting "a couple of pills to take home with me."

## 2012-09-07 NOTE — ED Notes (Addendum)
Pt c/o mid center to left side chest pain that radiates to left arm that started two days ago, pain has remained constant, described as sharp, is worse when laying down, with palpation, deep breathing. Denies any cough, chills, n/v, pt also states that she stopped taking a butrans patch two weeks ago, and that here last menstrual  period was July 30, 2012. Pt has tried tums, prevacid, and doubling up on her anxiety medication with no change in chest pain.

## 2012-09-07 NOTE — ED Provider Notes (Addendum)
CSN: [id number]     Arrival date & time 09/07/12  4098 History    This chart was scribed for Shelda Jakes, MD,  by Ashley Jacobs, ED Scribe. The patient was seen in room APA14/APA14 and the patient's care was started at 8:40 AM   Chief Complaint  Patient presents with  . Chest Pain   Patient is a 37 y.o. female presenting with chest pain. The history is provided by the patient and medical records. No language interpreter was used.  Chest Pain Pain location:  Substernal area Pain quality: radiating   Pain radiates to:  L arm and neck Pain severity:  Severe Duration:  2 days Timing:  Constant Progression:  Worsening Chronicity:  New Context: not lifting   Relieved by:  Nothing Associated symptoms: headache and shortness of breath   Associated symptoms: no abdominal pain, no fever, no nausea and not vomiting   Headaches:    Severity:  Mild   Onset quality:  Gradual   Duration:  2 days   Timing:  Constant   Progression:  Worsening   Chronicity:  New  HPI Comments: Sheri Vargas is a 37 y.o. female who presents to the Emergency Department complaining of substernal and left chest constant, chest pain with onset of two days ago. Pt describes the pain as 10/10, sharp, and radiates to her neck and left arm. Pt explains that she had a similar episode that was attributed to lifting heavy items, however, she denies lifting anything heavy prior to the event. She suggests that the pain maybe associated with acid reflux and feel anxious after loosing her job.  She also mentions that her feet remains cold and nothing seems to relieve it. Pt reports having a hx of frequent headaches that is gradually worsening for the past two days. Her last mense was last month and that she is late this month. She has hx  Of chronic back pain, menorrhagia, thyroid disease and anxiety. She has the associated symptom of SOB and denies congestion, sore throat and vomiting.   Pt's PCP is Dr. Catalina Pizza  Past Medical History  Diagnosis Date  . Anxiety   . Degenerative disc disease   . Depression   . Ectopic pregnancy, tubal     5 ectopic pregnancies  . Kidney stones   . Chronic back pain 06/17/2012  . Menorrhagia 06/17/2012  . Decreased libido 06/17/2012  . Thyroid disease   . Hypothyroid 06/25/2012    TSH 5.561, will rx synthroid 25 mcg and recheck in 8 weeks   Past Surgical History  Procedure Laterality Date  . Total hip arthroplasty      left x 2   . Cholecystectomy  2004  . Back surgery      cyst removed  . Ectopic pregnancy surgery     Family History  Problem Relation Age of Onset  . Hypertension Father   . Hypertension Paternal Uncle   . Diabetes Paternal Grandmother   . Hypertension Paternal Uncle   . Kidney Stones Brother   . Hypertension Son    History  Substance Use Topics  . Smoking status: Current Every Day Smoker -- 1.00 packs/day for 20 years    Types: Cigarettes    Last Attempt to Quit: 01/20/1994  . Smokeless tobacco: Never Used  . Alcohol Use: No   OB History   Grav Para Term Preterm Abortions TAB SAB Ect Mult Living   6 1   5    5  1     Review of Systems  Constitutional: Negative for fever and chills.  HENT: Negative for congestion, sore throat and rhinorrhea.   Respiratory: Positive for shortness of breath.   Cardiovascular: Positive for chest pain. Negative for leg swelling.  Gastrointestinal: Negative for nausea, vomiting, abdominal pain and diarrhea.  Genitourinary: Negative for dysuria.  Skin: Negative for rash.  Neurological: Positive for headaches.  Hematological: Does not bruise/bleed easily.  Psychiatric/Behavioral: The patient is nervous/anxious.   All other systems reviewed and are negative.    Allergies  Latex  Home Medications   Current Outpatient Rx  Name  Route  Sig  Dispense  Refill  . acetaminophen (TYLENOL) 500 MG tablet   Oral   Take 1,000 mg by mouth every 6 (six) hours as needed. For pain         .  Cholecalciferol (VITAMIN D PO)   Oral   Take 1 tablet by mouth daily.         . diphenhydrAMINE (BENADRYL) 25 MG tablet   Oral   Take 25 mg by mouth every 6 (six) hours as needed. For allergies         . FLUoxetine (PROZAC) 20 MG capsule   Oral   Take 20 mg by mouth 2 (two) times daily.         Marland Kitchen HYDROcodone-acetaminophen (NORCO) 7.5-325 MG per tablet   Oral   Take 1 tablet by mouth every 6 (six) hours as needed for pain.         Marland Kitchen ibuprofen (ADVIL,MOTRIN) 200 MG tablet   Oral   Take 800 mg by mouth every 6 (six) hours as needed. For pain         . levothyroxine (SYNTHROID, LEVOTHROID) 50 MCG tablet   Oral   Take 1 tablet (50 mcg total) by mouth daily before breakfast.   30 tablet   6   . LORazepam (ATIVAN) 1 MG tablet   Oral   Take 1 mg by mouth every 8 (eight) hours. For anxiety         . buprenorphine (BUTRANS) 20 MCG/HR PTWK   Transdermal   Place 20 mcg onto the skin once a week.         . famotidine (PEPCID) 20 MG tablet   Oral   Take 1 tablet (20 mg total) by mouth 2 (two) times daily.   30 tablet   0   . LORazepam (ATIVAN) 1 MG tablet   Oral   Take 1 tablet (1 mg total) by mouth 3 (three) times daily as needed for anxiety.   10 tablet   0   . traMADol (ULTRAM) 50 MG tablet   Oral   Take 1 tablet (50 mg total) by mouth every 6 (six) hours as needed.   14 tablet   0    BP 109/73  Pulse 83  Temp(Src) 99.1 F (37.3 C) (Oral)  Resp 12  Ht 5\' 3"  (1.6 m)  Wt 155 lb (70.308 kg)  BMI 27.46 kg/m2  SpO2 99%  LMP 07/30/2012 Physical Exam  Nursing note and vitals reviewed. Constitutional: She is oriented to person, place, and time. She appears well-developed and well-nourished. No distress.  HENT:  Head: Normocephalic and atraumatic.  Mouth/Throat: Oropharynx is clear and moist.  Eyes: Conjunctivae are normal. Pupils are equal, round, and reactive to light. No scleral icterus.  Neck: Neck supple.  Cardiovascular: Normal rate, regular  rhythm, normal heart sounds and intact distal pulses.  No murmur heard. Pulmonary/Chest: Effort normal and breath sounds normal. No stridor. No respiratory distress. She has no rales.  Abdominal: Soft. Bowel sounds are normal. There is no tenderness. There is no rebound and no guarding.  Musculoskeletal: Normal range of motion. She exhibits no edema and no tenderness.  Neurological: She is alert and oriented to person, place, and time. No cranial nerve deficit. She exhibits normal muscle tone. Coordination normal.  Skin: Skin is warm and dry. No rash noted.  Psychiatric: She has a normal mood and affect. Her behavior is normal.    ED Course  DIAGNOSTIC STUDIES: Oxygen Saturation is 99% on room air, normal by my interpretation.    COORDINATION OF CARE: 8:44 AM Discussed course of care with pt . Pt understands and agrees.  Medications  0.9 %  sodium chloride infusion (not administered)  HYDROmorphone (DILAUDID) injection 1 mg (0 mg Intravenous Hold 09/07/12 0919)  ondansetron (ZOFRAN) injection 4 mg (0 mg Intravenous Hold 09/07/12 0919)  sodium chloride 0.9 % bolus 1,000 mL (0 mL Intravenous Stopped 09/07/12 1102)  pantoprazole (PROTONIX) injection 40 mg (40 mg Intravenous Given 09/07/12 0918)     Procedures (including critical care time)  Labs Reviewed  COMPREHENSIVE METABOLIC PANEL - Abnormal; Notable for the following:    Glucose, Bld 103 (*)    Creatinine, Ser 0.49 (*)    Total Bilirubin 0.2 (*)    All other components within normal limits  CBC  TROPONIN I  D-DIMER, QUANTITATIVE  PREGNANCY, URINE   Results for orders placed during the hospital encounter of 09/07/12  CBC      Result Value Range   WBC 7.7  4.0 - 10.5 K/uL   RBC 4.20  3.87 - 5.11 MIL/uL   Hemoglobin 13.2  12.0 - 15.0 g/dL   HCT 91.4  78.2 - 95.6 %   MCV 91.2  78.0 - 100.0 fL   MCH 31.4  26.0 - 34.0 pg   MCHC 34.5  30.0 - 36.0 g/dL   RDW 21.3  08.6 - 57.8 %   Platelets 335  150 - 400 K/uL  COMPREHENSIVE  METABOLIC PANEL      Result Value Range   Sodium 137  135 - 145 mEq/L   Potassium 3.8  3.5 - 5.1 mEq/L   Chloride 103  96 - 112 mEq/L   CO2 27  19 - 32 mEq/L   Glucose, Bld 103 (*) 70 - 99 mg/dL   BUN 13  6 - 23 mg/dL   Creatinine, Ser 4.69 (*) 0.50 - 1.10 mg/dL   Calcium 9.4  8.4 - 62.9 mg/dL   Total Protein 7.2  6.0 - 8.3 g/dL   Albumin 3.6  3.5 - 5.2 g/dL   AST 19  0 - 37 U/L   ALT 27  0 - 35 U/L   Alkaline Phosphatase 51  39 - 117 U/L   Total Bilirubin 0.2 (*) 0.3 - 1.2 mg/dL   GFR calc non Af Amer >90  >90 mL/min   GFR calc Af Amer >90  >90 mL/min  TROPONIN I      Result Value Range   Troponin I <0.30  <0.30 ng/mL  D-DIMER, QUANTITATIVE      Result Value Range   D-Dimer, Quant 0.34  0.00 - 0.48 ug/mL-FEU  PREGNANCY, URINE      Result Value Range   Preg Test, Ur NEGATIVE  NEGATIVE     Date: 09/07/2012  Rate: 81  Rhythm: normal sinus rhythm  QRS Axis: normal  Intervals: normal  ST/T Wave abnormalities: normal  Conduction Disutrbances:none  Narrative Interpretation:   Old EKG Reviewed: none available    Dg Chest Portable 1 View  09/07/2012   *RADIOLOGY REPORT*  Clinical Data: Chest pain  PORTABLE CHEST - 1 VIEW  Comparison: March 03, 2011.  Findings: Cardiomediastinal silhouette appears normal.  Bony thorax is intact.  Minimal opacity is noted in both lung bases most consistent with subsegmental atelectasis secondary to hypoinflation of the lungs.  No pneumothorax or pleural effusion is noted.  IMPRESSION: Minimal bilateral basilar subsegmental atelectasis is noted most likely due to hypoinflation of the lungs.  No other abnormality is noted.   Original Report Authenticated By: Lupita Raider.,  M.D.   1. Chest pain   2. Anxiety     MDM  Patient's workup for the chest pain is negative for acute cardiac event normal EKG normal troponin and chest x-rays negative for pneumonia pneumothorax or pulmonary edema. D-dimer is negative not consistent with pulmonary  embolism. Suspect patient's chest pain is perhaps related to reflux disease we'll treat with Pepcid her followup with her primary care Dr. It could be a component of anxiety patient states she's been under a lot of pressure. To treat the pain with the tramadol and the anxiety with a short course of Ativan.        I personally performed the services described in this documentation, which was scribed in my presence. The recorded information has been reviewed and is accurate.     Shelda Jakes, MD 09/07/12 1105   Addendum: Unfortunately patient never got her IV hydromorphone injection before the IV was removed. We'll go head and treat her with IM hydromorphone. Patient had already been taking hydrocodone at home. Is not helping her chest pain we'll treat with medications prescribed and have her followup with her record Dr. Donn Pierini patient has not had frequent visits to the ED for pain related complaints.  Shelda Jakes, MD 09/07/12 [phone]

## 2012-11-25 ENCOUNTER — Other Ambulatory Visit: Payer: Self-pay

## 2013-02-16 IMAGING — CR DG CHEST 2V
2 series · 2 of 2 positions shown · non-contrast
Comparison: 12/21/2007

CLINICAL DATA: Chest pain, weakness, dizziness, recent diagnosis of
pneumonia, history smoking

CHEST - 2 VIEW

[view not recorded (1 of 2)]
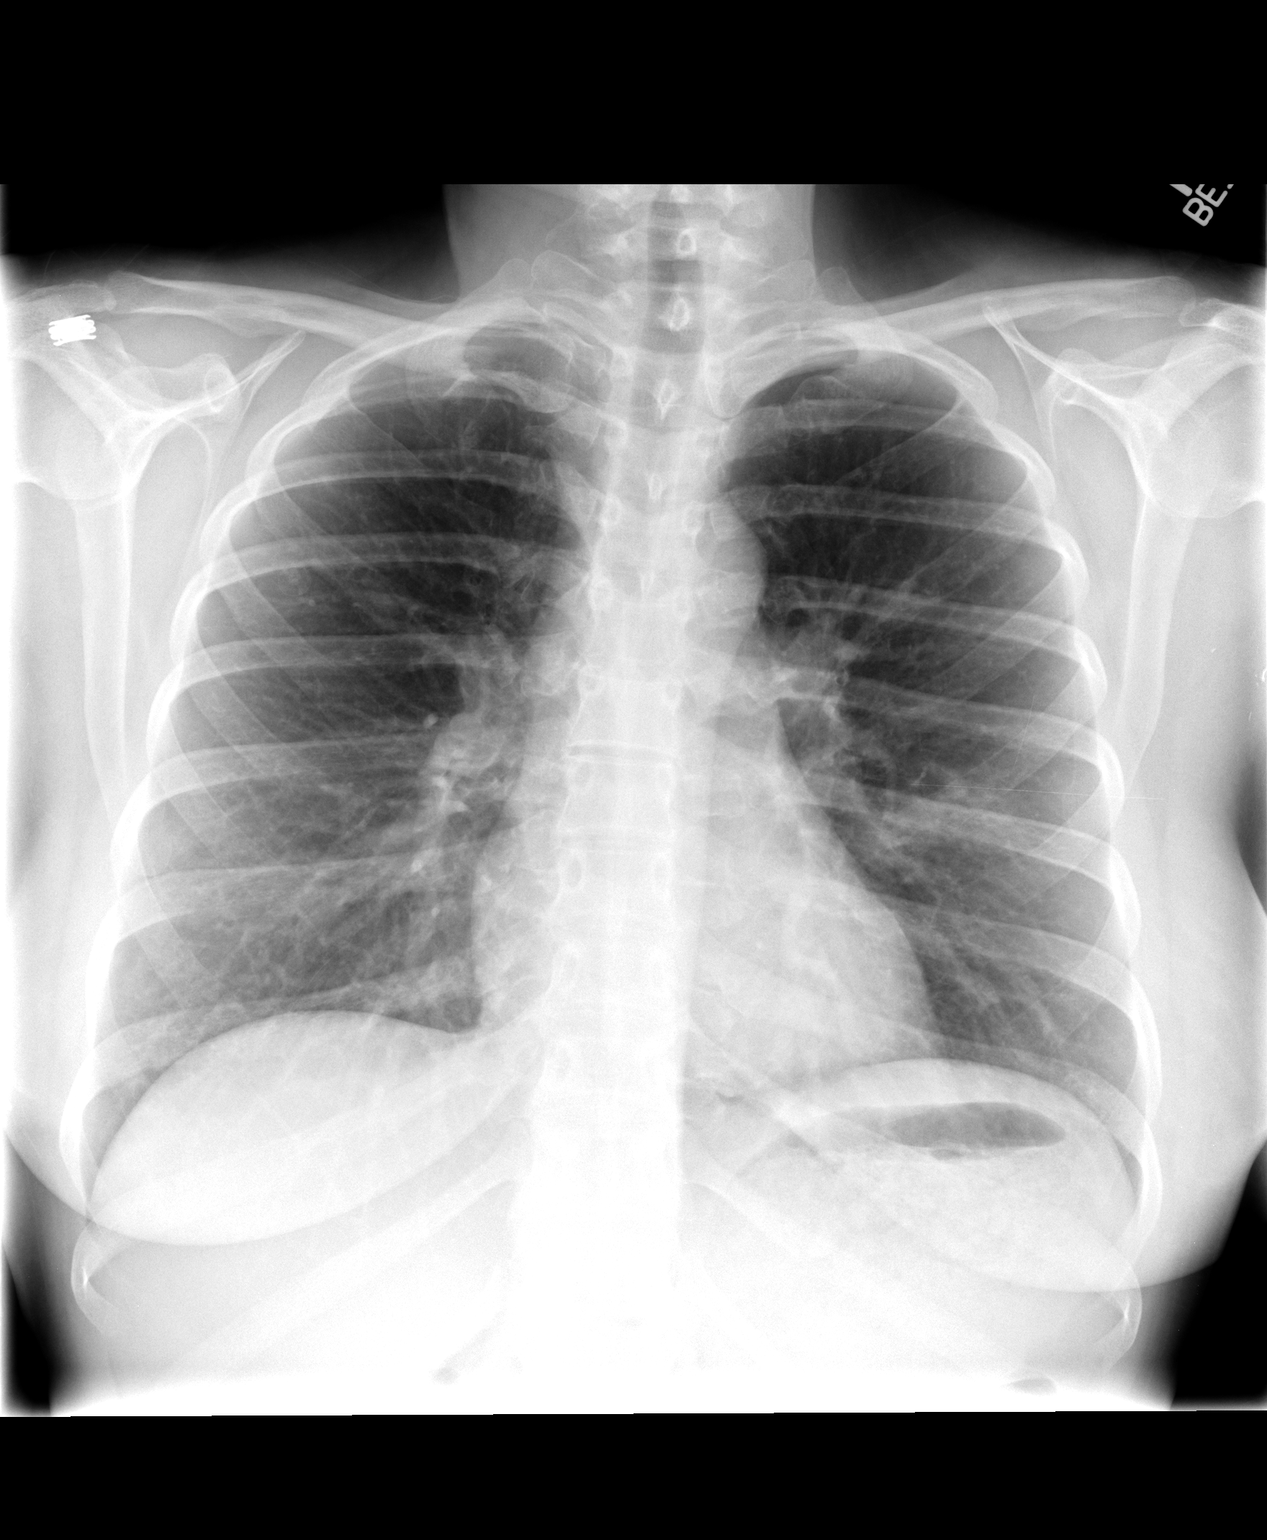

[view not recorded (2 of 2)]
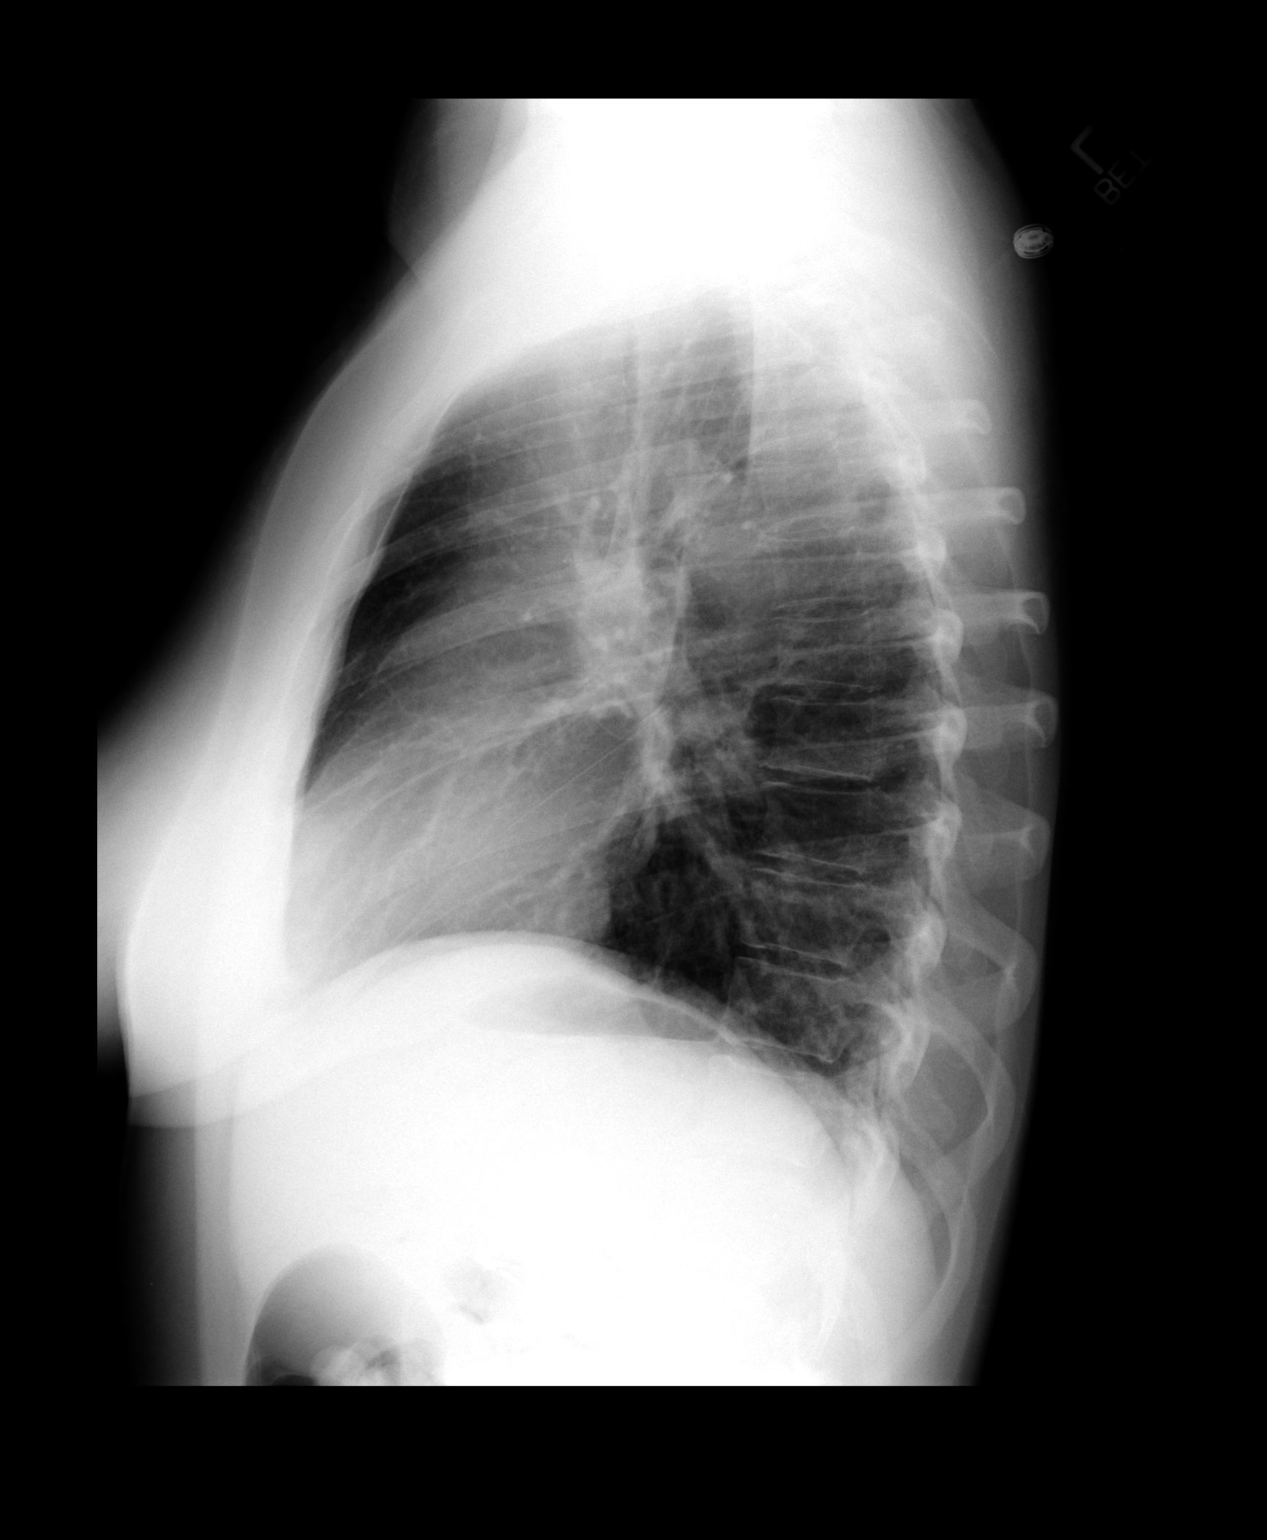

[2 of 2 positions shown; findings below may reference images not displayed]

FINDINGS: Normal heart size, mediastinal contours, and pulmonary vascularity.
Mild bronchitic changes without infiltrate or effusion.
No pneumothorax.
Osseous structures unremarkable.
IMPRESSION: Mild bronchitic changes.

## 2013-03-06 ENCOUNTER — Emergency Department (HOSPITAL_COMMUNITY)
Admission: EM | Admit: 2013-03-06 | Discharge: 2013-03-06 | Disposition: A | Discharge status: Home / Self Care | Payer: Self-pay | Attending: Emergency Medicine | Admitting: Emergency Medicine

## 2013-03-06 ENCOUNTER — Encounter (HOSPITAL_COMMUNITY): Payer: Self-pay | Admitting: Emergency Medicine

## 2013-03-06 DIAGNOSIS — F3289 Other specified depressive episodes: Secondary | ICD-10-CM | POA: Insufficient documentation

## 2013-03-06 DIAGNOSIS — Z8739 Personal history of other diseases of the musculoskeletal system and connective tissue: Secondary | ICD-10-CM | POA: Insufficient documentation

## 2013-03-06 DIAGNOSIS — Z8742 Personal history of other diseases of the female genital tract: Secondary | ICD-10-CM | POA: Insufficient documentation

## 2013-03-06 DIAGNOSIS — F411 Generalized anxiety disorder: Secondary | ICD-10-CM | POA: Insufficient documentation

## 2013-03-06 DIAGNOSIS — Z91040 Latex allergy status: Secondary | ICD-10-CM | POA: Insufficient documentation

## 2013-03-06 DIAGNOSIS — G8929 Other chronic pain: Secondary | ICD-10-CM | POA: Insufficient documentation

## 2013-03-06 DIAGNOSIS — F329 Major depressive disorder, single episode, unspecified: Secondary | ICD-10-CM | POA: Insufficient documentation

## 2013-03-06 DIAGNOSIS — L03317 Cellulitis of buttock: Principal | ICD-10-CM

## 2013-03-06 DIAGNOSIS — Z79899 Other long term (current) drug therapy: Secondary | ICD-10-CM | POA: Insufficient documentation

## 2013-03-06 DIAGNOSIS — L0231 Cutaneous abscess of buttock: Secondary | ICD-10-CM | POA: Insufficient documentation

## 2013-03-06 DIAGNOSIS — Z87442 Personal history of urinary calculi: Secondary | ICD-10-CM | POA: Insufficient documentation

## 2013-03-06 DIAGNOSIS — E039 Hypothyroidism, unspecified: Secondary | ICD-10-CM | POA: Insufficient documentation

## 2013-03-06 DIAGNOSIS — F17200 Nicotine dependence, unspecified, uncomplicated: Secondary | ICD-10-CM | POA: Insufficient documentation

## 2013-03-06 MED ORDER — HYDROCODONE-ACETAMINOPHEN 5-325 MG PO TABS
1.0000 | ORAL_TABLET | Freq: Once | ORAL | Status: AC
  Administered 2013-03-06: 1 via ORAL
  Filled 2013-03-06: qty 1

## 2013-03-06 MED ORDER — HYDROCODONE-ACETAMINOPHEN 5-325 MG PO TABS: 1.0000 | ORAL_TABLET | Freq: Four times a day (QID) | ORAL | Status: DC | PRN

## 2013-03-06 MED ORDER — LIDOCAINE HCL (PF) 1 % IJ SOLN
5.0000 mL | Freq: Once | INTRAMUSCULAR | Status: AC
  Administered 2013-03-06: 5 mL via INTRADERMAL
  Filled 2013-03-06: qty 5

## 2013-03-06 MED ORDER — HYDROMORPHONE HCL PF 1 MG/ML IJ SOLN: 1.0000 mg | Freq: Once | INTRAMUSCULAR | Status: DC

## 2013-03-06 MED ORDER — SULFAMETHOXAZOLE-TMP DS 800-160 MG PO TABS
1.0000 | ORAL_TABLET | Freq: Once | ORAL | Status: AC
  Administered 2013-03-06: 1 via ORAL
  Filled 2013-03-06: qty 1

## 2013-03-06 MED ORDER — HYDROMORPHONE HCL PF 1 MG/ML IJ SOLN
1.0000 mg | Freq: Once | INTRAMUSCULAR | Status: AC
  Administered 2013-03-06: 1 mg via INTRAMUSCULAR
  Filled 2013-03-06: qty 1

## 2013-03-06 MED ORDER — SULFAMETHOXAZOLE-TRIMETHOPRIM 800-160 MG PO TABS: 1.0000 | ORAL_TABLET | Freq: Two times a day (BID) | ORAL | Status: DC

## 2013-03-06 NOTE — ED Notes (Addendum)
Pt states she has an abcess on her left buttocks. Pt states it is draining some and is very painful.

## 2013-03-06 NOTE — ED Provider Notes (Signed)
CSN: [id number]     Arrival date & time 03/06/13  1950 History  This chart was scribed for Gerhard Munchobert Shanasia Ibrahim, MD by Blanchard KelchNicole Curnes, ED Scribe. The patient was seen in room APA08/APA08. Patient's care was started at 8:18 PM.    Chief Complaint  Patient presents with  . Recurrent Skin Infections     The history is provided by the patient. No language interpreter was used.    HPI Comments: Sheri Vargas is a 38 y.o. female who presents to the Emergency Department complaining of a constant, draining, erythematous abscess that appeared about four days ago. She states that she has constant pain to the affected area. She denies taking medication for the pain. She had an abscess in the same location about a month ago that resolved on its own without I&D. She denies any other prior abscesses. She denies fever, chills, vomiting or diarrhea. She denies any allergies to medications. She is a current smoker.   PCP is Dr. Margo AyeHall.   Past Medical History  Diagnosis Date  . Anxiety   . Degenerative disc disease   . Depression   . Ectopic pregnancy, tubal     5 ectopic pregnancies  . Kidney stones   . Chronic back pain 06/17/2012  . Menorrhagia 06/17/2012  . Decreased libido 06/17/2012  . Thyroid disease   . Hypothyroid 06/25/2012    TSH 5.561, will rx synthroid 25 mcg and recheck in 8 weeks   Past Surgical History  Procedure Laterality Date  . Cholecystectomy  2004  . Back surgery      cyst removed  . Ectopic pregnancy surgery     Family History  Problem Relation Age of Onset  . Hypertension Father   . Hypertension Paternal Uncle   . Diabetes Paternal Grandmother   . Hypertension Paternal Uncle   . Kidney Stones Brother   . Hypertension Son    History  Substance Use Topics  . Smoking status: Current Every Day Smoker -- 1.00 packs/day for 20 years    Types: Cigarettes    Last Attempt to Quit: 01/20/1994  . Smokeless tobacco: Never Used  . Alcohol Use: No   OB History   Grav Para Term  Preterm Abortions TAB SAB Ect Mult Living   6 1   5   5  1      Review of Systems  Constitutional:       Per HPI, otherwise negative  HENT:       Per HPI, otherwise negative  Respiratory:       Per HPI, otherwise negative  Cardiovascular:       Per HPI, otherwise negative  Gastrointestinal: Negative for vomiting.  Endocrine:       Negative aside from HPI  Genitourinary:       Neg aside from HPI   Musculoskeletal:       Per HPI, otherwise negative  Skin: Negative.   Neurological: Negative for syncope.      Allergies  Latex  Home Medications   Current Outpatient Rx  Name  Route  Sig  Dispense  Refill  . acetaminophen (TYLENOL) 500 MG tablet   Oral   Take 1,000 mg by mouth every 6 (six) hours as needed. For pain         . buprenorphine (BUTRANS) 20 MCG/HR PTWK   Transdermal   Place 20 mcg onto the skin once a week.         . Cholecalciferol (VITAMIN D PO)   Oral  Take 1 tablet by mouth daily.         . diphenhydrAMINE (BENADRYL) 25 MG tablet   Oral   Take 25 mg by mouth every 6 (six) hours as needed. For allergies         . famotidine (PEPCID) 20 MG tablet   Oral   Take 1 tablet (20 mg total) by mouth 2 (two) times daily.   30 tablet   0   . FLUoxetine (PROZAC) 20 MG capsule   Oral   Take 20 mg by mouth 2 (two) times daily.         Marland Kitchen HYDROcodone-acetaminophen (NORCO) 7.5-325 MG per tablet   Oral   Take 1 tablet by mouth every 6 (six) hours as needed for pain.         Marland Kitchen ibuprofen (ADVIL,MOTRIN) 200 MG tablet   Oral   Take 800 mg by mouth every 6 (six) hours as needed. For pain         . levothyroxine (SYNTHROID, LEVOTHROID) 50 MCG tablet   Oral   Take 1 tablet (50 mcg total) by mouth daily before breakfast.   30 tablet   6   . LORazepam (ATIVAN) 1 MG tablet   Oral   Take 1 mg by mouth every 8 (eight) hours. For anxiety         . LORazepam (ATIVAN) 1 MG tablet   Oral   Take 1 tablet (1 mg total) by mouth 3 (three) times daily  as needed for anxiety.   10 tablet   0   . traMADol (ULTRAM) 50 MG tablet   Oral   Take 1 tablet (50 mg total) by mouth every 6 (six) hours as needed.   14 tablet   0    BP 129/75  Pulse 81  Temp(Src) 98.4 F (36.9 C) (Oral)  Resp 20  Ht 5\' 3"  (1.6 m)  Wt 144 lb 4.8 oz (65.454 kg)  BMI 25.57 kg/m2  SpO2 98%  LMP 02/20/2013 Physical Exam  Nursing note and vitals reviewed. Constitutional: She is oriented to person, place, and time. She appears well-developed and well-nourished. No distress.  HENT:  Head: Normocephalic and atraumatic.  Eyes: Conjunctivae and EOM are normal.  Cardiovascular: Normal rate.   Pulmonary/Chest: Effort normal and breath sounds normal. No stridor. No respiratory distress.  Abdominal: She exhibits no distension.  Musculoskeletal: She exhibits no edema.  Neurological: She is alert and oriented to person, place, and time. No cranial nerve deficit.  Skin: Skin is warm and dry. There is erythema.  Left superior gluteus, left of midline about 6 cm in diameter, indurated. Not fluctuant. Erythema spreading about 15 cm from the indurated area.  Psychiatric: She has a normal mood and affect.    ED Course  Procedures (including critical care time)   COORDINATION OF CARE: 8:22 PM -Will perform I&D. Patient states that she has a ride from the ED. Recommend follow up with PCP in the next few weeks to discuss recurrent infections. Patient verbalizes understanding and agrees with treatment plan.  INCISION AND DRAINAGE PROCEDURE NOTE: Patient identification was confirmed and verbal consent was obtained. This procedure was performed by Gerhard Munch, MD at 8:48 PM. Site: left superior gluteus  Sterile procedures observed Needle size: 25 gauge Anesthetic used (type and amt): 6 mL of 1% lidocaine with norepinephrine Blade size: 11 blade, 2 cm incision Drainage: purulent material  Complexity: Complex Packing not used Site anesthetized, incision made over  site, wound drained and  explored loculations, rinsed with copious amounts of normal saline, wound packed with sterile gauze, covered with dry, sterile dressing.  Pt tolerated procedure well without complications.  Instructions for care discussed verbally and pt provided with additional written instructions for homecare and f/u.    Labs Review Labs Reviewed - No data to display Imaging Review No results found.  EKG Interpretation   None       MDM    I personally performed the services described in this documentation, which was scribed in my presence. The recorded information has been reviewed and is accurate.   Patient presents with abscess and cellulitis of the right gluteal region.  Patient incision, drainage, well tolerated.  She was started on antibiotics, discharged to follow up with primary care.   Gerhard Munch, MD 03/06/13 2123

## 2013-03-06 NOTE — Discharge Instructions (Signed)
As discussed, to facilitate healing of your abscess and cellulitis, please use sitz baths 4 times daily for the next 3 or 4 days.  Please keep the wound dry, clean when not sleeping.  Return here for any concerning changes in your condition.   Abscess An abscess is an infected area that contains a collection of pus and debris.It can occur in almost any part of the body. An abscess is also known as a furuncle or boil. CAUSES  An abscess occurs when tissue gets infected. This can occur from blockage of oil or sweat glands, infection of hair follicles, or a minor injury to the skin. As the body tries to fight the infection, pus collects in the area and creates pressure under the skin. This pressure causes pain. People with weakened immune systems have difficulty fighting infections and get certain abscesses more often.  SYMPTOMS Usually an abscess develops on the skin and becomes a painful mass that is red, warm, and tender. If the abscess forms under the skin, you may feel a moveable soft area under the skin. Some abscesses break open (rupture) on their own, but most will continue to get worse without care. The infection can spread deeper into the body and eventually into the bloodstream, causing you to feel ill.  DIAGNOSIS  Your caregiver will take your medical history and perform a physical exam. A sample of fluid may also be taken from the abscess to determine what is causing your infection. TREATMENT  Your caregiver may prescribe antibiotic medicines to fight the infection. However, taking antibiotics alone usually does not cure an abscess. Your caregiver may need to make a small cut (incision) in the abscess to drain the pus. In some cases, gauze is packed into the abscess to reduce pain and to continue draining the area. HOME CARE INSTRUCTIONS   Only take over-the-counter or prescription medicines for pain, discomfort, or fever as directed by your caregiver.  If you were prescribed  antibiotics, take them as directed. Finish them even if you start to feel better.  If gauze is used, follow your caregiver's directions for changing the gauze.  To avoid spreading the infection:  Keep your draining abscess covered with a bandage.  Wash your hands well.  Do not share personal care items, towels, or whirlpools with others.  Avoid skin contact with others.  Keep your skin and clothes clean around the abscess.  Keep all follow-up appointments as directed by your caregiver. SEEK MEDICAL CARE IF:   You have increased pain, swelling, redness, fluid drainage, or bleeding.  You have muscle aches, chills, or a general ill feeling.  You have a fever. MAKE SURE YOU:   Understand these instructions.  Will watch your condition.  Will get help right away if you are not doing well or get worse. Document Released: 10/16/2004 Document Revised: 07/08/2011 Document Reviewed: 03/21/2011 [facility] Patient Information 2014 [place], [place].  Cellulitis Cellulitis is an infection of the skin and the tissue beneath it. The infected area is usually red and tender. Cellulitis occurs most often in the arms and lower legs.  CAUSES  Cellulitis is caused by bacteria that enter the skin through cracks or cuts in the skin. The most common types of bacteria that cause cellulitis are Staphylococcus and Streptococcus. SYMPTOMS   Redness and warmth.  Swelling.  Tenderness or pain.  Fever. DIAGNOSIS  Your caregiver can usually determine what is wrong based on a physical exam. Blood tests may also be done. TREATMENT  Treatment usually  involves taking an antibiotic medicine. HOME CARE INSTRUCTIONS   Take your antibiotics as directed. Finish them even if you start to feel better.  Keep the infected arm or leg elevated to reduce swelling.  Apply a warm cloth to the affected area up to 4 times per day to relieve pain.  Only take over-the-counter or prescription medicines for pain,  discomfort, or fever as directed by your caregiver.  Keep all follow-up appointments as directed by your caregiver. SEEK MEDICAL CARE IF:   You notice red streaks coming from the infected area.  Your red area gets larger or turns dark in color.  Your bone or joint underneath the infected area becomes painful after the skin has healed.  Your infection returns in the same area or another area.  You notice a swollen bump in the infected area.  You develop new symptoms. SEEK IMMEDIATE MEDICAL CARE IF:   You have a fever.  You feel very sleepy.  You develop vomiting or diarrhea.  You have a general ill feeling (malaise) with muscle aches and pains. MAKE SURE YOU:   Understand these instructions.  Will watch your condition.  Will get help right away if you are not doing well or get worse. Document Released: 10/16/2004 Document Revised: 07/08/2011 Document Reviewed: 03/24/2011 [facility] Patient Information 2014 [place].

## 2013-04-27 ENCOUNTER — Other Ambulatory Visit: Payer: Self-pay | Admitting: Adult Health

## 2013-04-28 ENCOUNTER — Other Ambulatory Visit: Payer: Self-pay | Admitting: *Deleted

## 2013-04-29 MED ORDER — LEVOTHYROXINE SODIUM 50 MCG PO TABS: ORAL_TABLET | ORAL | Status: DC

## 2013-08-10 ENCOUNTER — Other Ambulatory Visit: Payer: Self-pay | Admitting: Adult Health

## 2013-11-21 ENCOUNTER — Encounter (HOSPITAL_COMMUNITY): Payer: Self-pay | Admitting: Emergency Medicine

## 2013-12-29 ENCOUNTER — Encounter (HOSPITAL_COMMUNITY): Payer: Self-pay | Admitting: Emergency Medicine

## 2013-12-29 ENCOUNTER — Emergency Department (HOSPITAL_COMMUNITY): Payer: Self-pay

## 2013-12-29 ENCOUNTER — Emergency Department (HOSPITAL_COMMUNITY)
Admission: EM | Admit: 2013-12-29 | Discharge: 2013-12-30 | Disposition: A | Discharge status: Home / Self Care | Payer: Self-pay | Attending: Emergency Medicine | Admitting: Emergency Medicine

## 2013-12-29 DIAGNOSIS — G8929 Other chronic pain: Secondary | ICD-10-CM | POA: Insufficient documentation

## 2013-12-29 DIAGNOSIS — Z87442 Personal history of urinary calculi: Secondary | ICD-10-CM | POA: Insufficient documentation

## 2013-12-29 DIAGNOSIS — Z91040 Latex allergy status: Secondary | ICD-10-CM | POA: Insufficient documentation

## 2013-12-29 DIAGNOSIS — M545 Low back pain: Secondary | ICD-10-CM | POA: Insufficient documentation

## 2013-12-29 DIAGNOSIS — E079 Disorder of thyroid, unspecified: Secondary | ICD-10-CM | POA: Insufficient documentation

## 2013-12-29 DIAGNOSIS — Z8659 Personal history of other mental and behavioral disorders: Secondary | ICD-10-CM | POA: Insufficient documentation

## 2013-12-29 DIAGNOSIS — Z87448 Personal history of other diseases of urinary system: Secondary | ICD-10-CM | POA: Insufficient documentation

## 2013-12-29 DIAGNOSIS — R109 Unspecified abdominal pain: Secondary | ICD-10-CM

## 2013-12-29 DIAGNOSIS — Z3202 Encounter for pregnancy test, result negative: Secondary | ICD-10-CM | POA: Insufficient documentation

## 2013-12-29 DIAGNOSIS — Z720 Tobacco use: Secondary | ICD-10-CM | POA: Insufficient documentation

## 2013-12-29 DIAGNOSIS — Z792 Long term (current) use of antibiotics: Secondary | ICD-10-CM | POA: Insufficient documentation

## 2013-12-29 LAB — URINALYSIS, ROUTINE W REFLEX MICROSCOPIC
BILIRUBIN URINE: NEGATIVE
Glucose, UA: NEGATIVE mg/dL
KETONES UR: NEGATIVE mg/dL
Leukocytes, UA: NEGATIVE
NITRITE: NEGATIVE
PH: 5.5 (ref 5.0–8.0)
PROTEIN: NEGATIVE mg/dL
Specific Gravity, Urine: <1.005 — ABNORMAL LOW (ref 1.005–1.030)
Urobilinogen, UA: 0.2 mg/dL (ref 0.0–1.0)

## 2013-12-29 LAB — BASIC METABOLIC PANEL
Anion gap: 11 (ref 5–15)
BUN: 16 mg/dL (ref 6–23)
CO2: 25 mEq/L (ref 19–32)
CREATININE: 0.63 mg/dL (ref 0.50–1.10)
Calcium: 9.2 mg/dL (ref 8.4–10.5)
Chloride: 106 mEq/L (ref 96–112)
Glucose, Bld: 93 mg/dL (ref 70–99)
Potassium: 3.7 mEq/L (ref 3.7–5.3)
Sodium: 142 mEq/L (ref 137–147)

## 2013-12-29 LAB — CBC WITH DIFFERENTIAL/PLATELET
BASOS PCT: 0 % (ref 0–1)
Basophils Absolute: 0 10*3/uL (ref 0.0–0.1)
EOS ABS: 0.1 10*3/uL (ref 0.0–0.7)
Eosinophils Relative: 1 % (ref 0–5)
HEMATOCRIT: 36.1 % (ref 36.0–46.0)
HEMOGLOBIN: 12.4 g/dL (ref 12.0–15.0)
Lymphocytes Relative: 34 % (ref 12–46)
Lymphs Abs: 2.9 10*3/uL (ref 0.7–4.0)
MCH: 31.2 pg (ref 26.0–34.0)
MCHC: 34.3 g/dL (ref 30.0–36.0)
MCV: 90.7 fL (ref 78.0–100.0)
MONO ABS: 0.6 10*3/uL (ref 0.1–1.0)
MONOS PCT: 8 % (ref 3–12)
NEUTROS PCT: 56 % (ref 43–77)
Neutro Abs: 4.7 10*3/uL (ref 1.7–7.7)
Platelets: 375 10*3/uL (ref 150–400)
RBC: 3.98 MIL/uL (ref 3.87–5.11)
RDW: 13.6 % (ref 11.5–15.5)
WBC: 8.3 10*3/uL (ref 4.0–10.5)

## 2013-12-29 LAB — URINE MICROSCOPIC-ADD ON

## 2013-12-29 LAB — PREGNANCY, URINE: Preg Test, Ur: NEGATIVE

## 2013-12-29 MED ORDER — METHOCARBAMOL 500 MG PO TABS: 1000.0000 mg | ORAL_TABLET | Freq: Four times a day (QID) | ORAL | Status: DC | PRN

## 2013-12-29 MED ORDER — OXYCODONE-ACETAMINOPHEN 5-325 MG PO TABS
1.0000 | ORAL_TABLET | Freq: Once | ORAL | Status: AC
  Administered 2013-12-29: 1 via ORAL
  Filled 2013-12-29: qty 1

## 2013-12-29 MED ORDER — HYDROCODONE-ACETAMINOPHEN 5-325 MG PO TABS: ORAL_TABLET | ORAL | Status: DC

## 2013-12-29 MED ORDER — KETOROLAC TROMETHAMINE 60 MG/2ML IM SOLN
60.0000 mg | Freq: Once | INTRAMUSCULAR | Status: AC
  Administered 2013-12-29: 60 mg via INTRAMUSCULAR
  Filled 2013-12-29: qty 2

## 2013-12-29 MED ORDER — NAPROXEN 250 MG PO TABS: 250.0000 mg | ORAL_TABLET | Freq: Two times a day (BID) | ORAL | Status: DC | PRN

## 2013-12-29 NOTE — Discharge Instructions (Signed)
°Emergency Department Resource Guide °1) Find a Doctor and Pay Out of Pocket °Although you won't have to find out who is covered by your insurance plan, it is a good idea to ask around and get recommendations. You will then need to call the office and see if the doctor you have chosen will accept you as a new patient and what types of options they offer for patients who are self-pay. Some doctors offer discounts or will set up payment plans for their patients who do not have insurance, but you will need to ask so you aren't surprised when you get to your appointment. ° °2) Contact Your Local Health Department °Not all health departments have doctors that can see patients for sick visits, but many do, so it is worth a call to see if yours does. If you don't know where your local health department is, you can check in your phone book. The CDC also has a tool to help you locate your state's health department, and many state websites also have listings of all of their local health departments. ° °3) Find a Walk-in Clinic °If your illness is not likely to be very severe or complicated, you may want to try a walk in clinic. These are popping up all over the country in pharmacies, drugstores, and shopping centers. They're usually staffed by nurse practitioners or physician assistants that have been trained to treat common illnesses and complaints. They're usually fairly quick and inexpensive. However, if you have serious medical issues or chronic medical problems, these are probably not your best option. ° °No Primary Care Doctor: °- Call [facility] at  832-8000 - they can help you locate a primary care doctor that  accepts your insurance, provides certain services, etc. °- Physician Referral Service- 1-800-533-3463 ° °Chronic Pain Problems: °Organization         Address  Phone   Notes  °[facility]  (336) [phone] Patients need to be referred by their primary care doctor.  ° °Medication  Assistance: °Organization         Address  Phone   Notes  °[place] [facility] [address] °[place], [place] [address] (336) 641-8030 --Must be a resident of [place] °-- Must have NO insurance coverage whatsoever (no Medicaid/ Medicare, etc.) °-- The pt. MUST have a primary care doctor that directs their care regularly and follows them in the community °  °[facility]  (866) 331-1348   °[employer]  (888) 892-1162   ° °Agencies that provide inexpensive medical care: °Organization         Address  Phone   Notes  °[facility]  (336) 832-8035   °[facility]    (336) 832-7272   °[facility] [address] °[place], [place] [address] (336) 832-4777   °[facility] [address], °[place] (336) 271-4999   °[facility]    (336) 373-0678   °[facility]    (336) 272-1050   °[facility] ° [address], [place] Phone:  (336) 832-4444, Fax:  (336) 832-4440 Hours of Operation:  9 am - 6 pm, M-F.  Also accepts Medicaid/Medicare and self-pay.  °[facility] ° [address], [place] Phone: (336) [phone], Fax: (336) [phone]. Hours of Operation:  8:30 am - 5:30 pm, M-F.  Also accepts Medicaid and self-pay.  °[facility] [place] [address], [place] Phone: (336) 878-6027   °[facility] [address], [place], [place] (336)723-1848, Ext. 123 Mondays & Thursdays: 7-9 AM.  First 15 patients are seen on a first come, first serve basis. °  ° °Medicaid-accepting [place] Providers: ° °Organization         Address  Phone   Notes  °[facility] [address], [place] (336) 641-2100 Also accepts self-pay patients.  °[facility] [address], [place] ° (336) 856-9996   °[facility] [address], [place]  (336) 288-8857   °[facility] [address] [place] [address], [place] (336) 299-7000   °Veita Bland [address], [place]  ° (336) 373-1557 Only accepts [place] Access Medicaid patients after they have their name applied to their card.  ° °Self-Pay (no insurance) in [place]: ° °Organization         Address  Phone   Notes  °Sickle Cell Patients, [facility] [address], [place] (336) 832-1970   °[facility] [address], [place] (336) 832-4400   °[facility] ° [address], [place] (336) 992-4800   °[facility]/Dr. Osei-Bonsu ° [address] [place] [address], [place] or [address], [place] (336) [phone] Phone number for both [place] and [place] locations is the same.  °[facility] [address], [place] (336) 299-0000   °[facility] [address] [place] [address], [place] or [address] (336) 852-7530 °(336) 878-2260   °[facility] [address], [place] (336) 350-1642, phone; (336) 294-5005, fax Sees patients 1st and 3rd Saturday of every month.  Must not qualify for public or private insurance (i.e. Medicaid, Medicare, [place] Health Choice, Veterans' Benefits) • Household income should be no more than 200% of the poverty level •The clinic cannot treat you if you are pregnant or think you are pregnant • Sexually transmitted diseases are not treated at the clinic.  ° ° °Dental Care: °Organization         Address  Phone  Notes  °[place] [facility] [address], [place] (336) 641-6152 Accepts children up to age 21 who are enrolled in Medicaid or [place] Health Choice; pregnant women with a Medicaid card; and children who have applied for Medicaid or [place] Health Choice, but were declined, whose parents can pay a reduced fee at time of service.  °[place]  [facility] [place]  [address], [place] (336) 641-7733 Accepts children up to age 21 who are enrolled in Medicaid or [place] Health Choice; pregnant women with a Medicaid card; and children who have applied for Medicaid or [place] Health Choice, but were declined, whose parents can pay a reduced fee at time of service.  °[facility] ° [address], [place] (336) 641-4533 Patients are seen by appointment only. Walk-ins are not accepted. [facility] will see patients 18 years of age and older. °Monday - Tuesday (8am-5pm) °Most Wednesdays (8:30-5pm) °$30 per visit, cash only  °[facility] ° [address], [place] (336) 641-4533 Patients are seen by appointment only. Walk-ins are not accepted. [facility] will see patients 18 years of age and older. °One   Wednesday Evening (Monthly: Volunteer Based).  $30 per visit, cash only  °[facility]  (919) [phone] for adults; Children under age 4, call Graduate Pediatric Dentistry at ([phone]) [phone]. Children aged 4-14, please call ([phone]) [phone] to request a pediatric application. ° Dental services are provided in all areas of dental care including fillings, crowns and bridges, complete and partial dentures, implants, gum treatment, root canals, and extractions. Preventive care is also provided. Treatment is provided to both adults and children. °Patients are selected via a lottery and there is often a waiting list. °  °[facility] [address], °[place] ° (336) 763-8833 [email or web address] °  °[facility] [address], [place], [place] (336)723-1848, Ext. 123 Second and Fourth Thursday of each month, opens at 6:30 AM; Clinic ends at 9 AM.  Patients are seen on a first-come first-served basis, and a limited number are seen during each clinic.  ° °[facility] ° [address], [place], [place] (336) 723-7904    Eligibility Requirements °You must have lived in [place], [place], or [place] counties for at least the last three months. °  You cannot be eligible for state or federal sponsored healthcare insurance, including Veterans Administration, Medicaid, or Medicare. °  You generally cannot be eligible for healthcare insurance through your employer.  °  How to apply: °Eligibility screenings are held every Tuesday and Wednesday afternoon from 1:00 pm until 4:00 pm. You do not need an appointment for the interview!  °[facility] [address], [place], [place] 336-631-2330   °[facility]  336-342-8273   °[facility]  336-703-3100   °[facility]  336-570-6415   ° °Behavioral Health Resources in the Community: °Intensive Outpatient Programs °Organization         Address  Phone  Notes  °[facility] [address], [place], [place] 336-878-6098   °[facility] [address], [place], [place] 336-832-9800   °[facility] [address], [place], [place] ° 336-882-2125   °[facility] [address],  °[place], [place] 1-800-853-5163 or 336-641-4981   °Substance Abuse Resources °Organization         Address  Phone  Notes  °[facility]  336-882-2125   °[facility]  336-784-9470   °[facility]  336-285-9073   °[facility]  336-845-3988   °Residential & Outpatient Substance Abuse Program  1-800-659-3381   °Psychological Services °Organization         Address  Phone  Notes  °[facility]  336- 832-9600   °[facility]  336- 378-7881   °[facility] [address], [place] 1-800-853-5163 or 336-641-4981   ° °Mobile Crisis Teams °Organization         Address  Phone  Notes  °[facility], Mobile Crisis Care Unit  1-877-626-1772   °Assertive °[facility] ° [address]  [place], [place] 336-834-9664   °Sharon DeEsch [address] °[place] 336-554-5454   ° °Self-Help/Support Groups °Organization         Address  Phone             Notes  °[facility] of  - variety of support groups  336- 373-1402 Call for more information  °Narcotics Anonymous (NA), [facility] [address], °[place]  2 meetings at this location  ° °  Residential Treatment Programs Organization         Address  Phone  Notes  [facility] [address],    [place]  [phone])[phone]   [facility]  [address], [place] [phone], [place], [place] [phone]   [facility] [address], [place] [phone] Admissions: 8am-3pm M-F  [facility] [address]    [place], [place] [phone]   [facility] [address], [place], [place] [phone]   [facility] [address]  [place], [place] [phone]   [facility] Intensive Outpatient [address], [place], [place] [phone]   [facility] [address]  [place], [place] [phone] or [phone]) [phone]   [facility] [address] [place], [place] [phone] Accepts Medicaid  [facility] [address]  [place] [phone] Substance Abuse/Addiction Treatment   [facility]         Address  Phone  Notes  [facility]  [phone](888) ([phone]   Angie FavaJulie Brannon, PhD [address] [place], [place]   [phone]) [phone] or [phone](336) [phone]   [facility]   [address] [place], [place] [phone]) [phone]   [facility] [address], [place], [place] [phone](336) [phone] Insurance/Medicaid/sponsorship through [facility] [address]                                    [place], [place] [phone](336) [phone] Therapy/tele-psych/case    [facility] [address]   [place], [place] [phone](336) [phone]    Dr. Lolly MustacheArfeen  [phone]) [phone]   [facility]  [employer] [facility] 1) [address], [place] 2) [address], [place] 3)  [address], [place] [phone]) [phone]) [phone]  [phone](336) [phone]   [facility] Child Abuse Hotline (602)[phone](336) [phone] or [phone](336) [phone])[phone] (After Hours)      Take the prescriptions as directed.  Apply moist heat or ice to the area(s) of discomfort, for 15 minutes at a time, several times per day for the next few days.  Do not fall asleep on a heating or ice pack.  Call your regular medical doctor tomorrow to schedule a follow up appointment within the next week.  Return to the Emergency Department immediately if worsening.

## 2013-12-29 NOTE — ED Notes (Signed)
Patient upset that she has not received any pain medication. Wanting to know what is taking so long, states that "I feels like crap".

## 2013-12-29 NOTE — ED Notes (Signed)
Pt c/o intermittent bilateral flank pain and dysuria x 3 weeks with n/v since yesterday.

## 2013-12-29 NOTE — ED Notes (Signed)
Called patient to be placed in room. Patient not in waiting room at this time.

## 2013-12-29 NOTE — ED Provider Notes (Signed)
CSN: [id number]     Arrival date & time 12/29/13  1844 History   First MD Initiated Contact with Patient 12/29/13 2151     Chief Complaint  Patient presents with  . Flank Pain    HPI Pt was seen at 2145. Per pt, c/o gradual onset and persistence of constant low back "pain" for the past 3 weeks. Pain worsens with palpation of the area and body position changes. Pt states she "isn't sure" if she is having dysuria. Denies hematuria. Denies incont/retention of bowel or bladder, no saddle anesthesia, no focal motor weakness, no tingling/numbness in extremities, no fevers, no CP/SOB, no cough, no injury, no abd pain.     Past Medical History  Diagnosis Date  . Anxiety   . Degenerative disc disease   . Depression   . Ectopic pregnancy, tubal     5 ectopic pregnancies  . Kidney stones   . Chronic back pain 06/17/2012  . Menorrhagia 06/17/2012  . Decreased libido 06/17/2012  . Thyroid disease   . Hypothyroid 06/25/2012    TSH 5.561, will rx synthroid 25 mcg and recheck in 8 weeks   Past Surgical History  Procedure Laterality Date  . Cholecystectomy  2004  . Back surgery      cyst removed  . Ectopic pregnancy surgery     Family History  Problem Relation Age of Onset  . Hypertension Father   . Hypertension Paternal Uncle   . Diabetes Paternal Grandmother   . Hypertension Paternal Uncle   . Kidney Stones Brother   . Hypertension Son    History  Substance Use Topics  . Smoking status: Current Every Day Smoker -- 1.00 packs/day for 20 years    Types: Cigarettes    Last Attempt to Quit: 01/20/1994  . Smokeless tobacco: Never Used  . Alcohol Use: No   OB History    Gravida Para Term Preterm AB TAB SAB Ectopic Multiple Living   6 1   5   5  1      Review of Systems ROS: Statement: All systems negative except as marked or noted in the HPI; Constitutional: Negative for fever and chills. ; ; Eyes: Negative for eye pain, redness and discharge. ; ; ENMT: Negative for ear pain,  hoarseness, nasal congestion, sinus pressure and sore throat. ; ; Cardiovascular: Negative for chest pain, palpitations, diaphoresis, dyspnea and peripheral edema. ; ; Respiratory: Negative for cough, wheezing and stridor. ; ; Gastrointestinal: Negative for nausea, vomiting, diarrhea, abdominal pain, blood in stool, hematemesis, jaundice and rectal bleeding. . ; ; Genitourinary: Negative for dysuria, flank pain and hematuria. ; ; GYN:  No vaginal bleeding, no vaginal discharge, no vulvar pain. ;; Musculoskeletal: +LBP. Negative for neck pain. Negative for swelling and trauma.; ; Skin: Negative for pruritus, rash, abrasions, blisters, bruising and skin lesion.; ; Neuro: Negative for headache, lightheadedness and neck stiffness. Negative for weakness, altered level of consciousness , altered mental status, extremity weakness, paresthesias, involuntary movement, seizure and syncope.     Allergies  Latex  Home Medications   Prior to Admission medications   Medication Sig Start Date End Date Taking? Authorizing Provider  acetaminophen (TYLENOL) 500 MG tablet Take 1,000 mg by mouth every 6 (six) hours as needed. For pain    Historical Provider, MD  HYDROcodone-acetaminophen (NORCO/VICODIN) 5-325 MG per tablet Take 1 tablet by mouth every 6 (six) hours as needed. Patient not taking: Reported on 12/29/2013 03/06/13   Gerhard Munch, MD  ibuprofen (ADVIL,MOTRIN) 200 MG  tablet Take 800 mg by mouth every 6 (six) hours as needed. For pain    Historical Provider, MD  levothyroxine (SYNTHROID, LEVOTHROID) 50 MCG tablet TAKE ONE TABLET BY MOUTH ONCE DAILY BEFORE BREAKFAST --  **PATIENT  NEEDS  LAB  WORK  BEFORE  ANY  FURTHER  REFILLS  ARE  GIVEN** Patient not taking: Reported on 12/29/2013    Adline PotterJennifer A Griffin, NP  sulfamethoxazole-trimethoprim (SEPTRA DS) 800-160 MG per tablet Take 1 tablet by mouth every 12 (twelve) hours. Patient not taking: Reported on 12/29/2013 03/06/13   Gerhard Munchobert Lockwood, MD   BP 109/77  mmHg  Pulse 64  Temp(Src) 98.1 F (36.7 C) (Oral)  Resp 16  Ht 5\' 3"  (1.6 m)  Wt 142 lb (64.411 kg)  BMI 25.16 kg/m2  SpO2 100%  LMP 12/20/2013 Physical Exam  2150: Physical examination:  Nursing notes reviewed; Vital signs and O2 SAT reviewed;  Constitutional: Well developed, Well nourished, Well hydrated, In no acute distress; Head:  Normocephalic, atraumatic; Eyes: EOMI, PERRL, No scleral icterus; ENMT: Mouth and pharynx normal, Mucous membranes moist; Neck: Supple, Full range of motion, No lymphadenopathy; Cardiovascular: Regular rate and rhythm, No murmur, rub, or gallop; Respiratory: Breath sounds clear & equal bilaterally, No rales, rhonchi, wheezes.  Speaking full sentences with ease, Normal respiratory effort/excursion; Chest: Nontender, Movement normal; Abdomen: Soft, Nontender, Nondistended, Normal bowel sounds; Genitourinary: No CVA tenderness; Spine:  No midline CS, TS, LS tenderness. +mild TTP bilat lumbar paraspinal muscles. No rash.;; Extremities: Pulses normal, No tenderness, No edema, No calf edema or asymmetry.; Neuro: AA&Ox3, Major CN grossly intact.  Speech clear. No gross focal motor or sensory deficits in extremities. Climbs on and off stretcher easily by herself. Gait steady.; Skin: Color normal, Warm, Dry.   ED Course  Procedures  MDM  MDM Reviewed: previous chart, nursing note and vitals Interpretation: labs and CT scan      Results for orders placed or performed during the hospital encounter of 12/29/13  Urinalysis, Routine w reflex microscopic  Result Value Ref Range   Color, Urine YELLOW YELLOW   APPearance CLEAR CLEAR   Specific Gravity, Urine <1.005 (L) 1.005 - 1.030   pH 5.5 5.0 - 8.0   Glucose, UA NEGATIVE NEGATIVE mg/dL   Hgb urine dipstick TRACE (A) NEGATIVE   Bilirubin Urine NEGATIVE NEGATIVE   Ketones, ur NEGATIVE NEGATIVE mg/dL   Protein, ur NEGATIVE NEGATIVE mg/dL   Urobilinogen, UA 0.2 0.0 - 1.0 mg/dL   Nitrite NEGATIVE NEGATIVE    Leukocytes, UA NEGATIVE NEGATIVE  CBC with Differential  Result Value Ref Range   WBC 8.3 4.0 - 10.5 K/uL   RBC 3.98 3.87 - 5.11 MIL/uL   Hemoglobin 12.4 12.0 - 15.0 g/dL   HCT 40.936.1 81.136.0 - 91.446.0 %   MCV 90.7 78.0 - 100.0 fL   MCH 31.2 26.0 - 34.0 pg   MCHC 34.3 30.0 - 36.0 g/dL   RDW 78.213.6 95.611.5 - 21.315.5 %   Platelets 375 150 - 400 K/uL   Neutrophils Relative % 56 43 - 77 %   Neutro Abs 4.7 1.7 - 7.7 K/uL   Lymphocytes Relative 34 12 - 46 %   Lymphs Abs 2.9 0.7 - 4.0 K/uL   Monocytes Relative 8 3 - 12 %   Monocytes Absolute 0.6 0.1 - 1.0 K/uL   Eosinophils Relative 1 0 - 5 %   Eosinophils Absolute 0.1 0.0 - 0.7 K/uL   Basophils Relative 0 0 - 1 %   Basophils  Absolute 0.0 0.0 - 0.1 K/uL  Basic metabolic panel  Result Value Ref Range   Sodium 142 137 - 147 mEq/L   Potassium 3.7 3.7 - 5.3 mEq/L   Chloride 106 96 - 112 mEq/L   CO2 25 19 - 32 mEq/L   Glucose, Bld 93 70 - 99 mg/dL   BUN 16 6 - 23 mg/dL   Creatinine, Ser 4.090.63 0.50 - 1.10 mg/dL   Calcium 9.2 8.4 - 81.110.5 mg/dL   GFR calc non Af Amer >90 >90 mL/min   GFR calc Af Amer >90 >90 mL/min   Anion gap 11 5 - 15  Pregnancy, urine  Result Value Ref Range   Preg Test, Ur NEGATIVE NEGATIVE  Urine microscopic-add on  Result Value Ref Range   Squamous Epithelial / LPF FEW (A) RARE   WBC, UA 0-2 <3 WBC/hpf   RBC / HPF 0-2 <3 RBC/hpf   Bacteria, UA RARE RARE   Ct Renal Stone Study 12/29/2013   CLINICAL DATA:  Intermittent bilateral flank pain and dysuria for 3 weeks.  EXAM: CT ABDOMEN AND PELVIS WITHOUT CONTRAST  TECHNIQUE: Multidetector CT imaging of the abdomen and pelvis was performed following the standard protocol without IV contrast.  COMPARISON:  April 10, 2010  FINDINGS: There is no nephrolithiasis or hydroureteronephrosis bilaterally. The kidneys are normal on this noncontrast exam. The liver, spleen, pancreas, adrenal glands are normal. The patient is status post prior cholecystectomy. The aorta demonstrate mild  atherosclerosis without aneurysmal dilatation. There is no abdominal lymphadenopathy. There is no small bowel obstruction or diverticulitis. The appendix is normal.  Partial fluid-filled bladder is normal. The uterus is normal. There is no pelvic lymphadenopathy minimal free fluid is identified in the pelvis, likely physiologic. There are minimal dependent atelectasis of the posterior lung bases. No acute abnormality is identified within the visualized bones.  IMPRESSION: No nephrolithiasis or hydroureteronephrosis bilaterally.  No acute abnormality identified in the abdomen and pelvis. The appendix is normal.   Electronically Signed   By: Sherian ReinWei-Chen  Lin M.D.   On: 12/29/2013 22:57    2340:  Workup reassuring. Will tx symptomatically for msk pain at this time. Dx and testing d/w pt.  Questions answered.  Verb understanding, agreeable to d/c home with outpt f/u.   Samuel JesterKathleen Ameia Morency, DO 01/01/14 1441

## 2013-12-29 NOTE — Progress Notes (Signed)
Pt removed belly ring in CT. Ring placed in plastic bag and given back to patient.

## 2014-03-28 ENCOUNTER — Ambulatory Visit (INDEPENDENT_AMBULATORY_CARE_PROVIDER_SITE_OTHER): Payer: 59 | Admitting: Psychology

## 2014-03-28 DIAGNOSIS — F4323 Adjustment disorder with mixed anxiety and depressed mood: Secondary | ICD-10-CM | POA: Diagnosis not present

## 2014-04-12 ENCOUNTER — Ambulatory Visit (HOSPITAL_COMMUNITY): Payer: Self-pay | Admitting: Psychiatry

## 2014-05-02 ENCOUNTER — Encounter (HOSPITAL_COMMUNITY): Payer: Self-pay | Admitting: *Deleted

## 2014-05-02 ENCOUNTER — Ambulatory Visit (HOSPITAL_COMMUNITY): Payer: Self-pay | Admitting: Psychology

## 2014-05-10 ENCOUNTER — Encounter: Payer: Self-pay | Admitting: *Deleted

## 2014-05-10 ENCOUNTER — Ambulatory Visit: Payer: Self-pay | Admitting: Adult Health

## 2014-07-12 NOTE — Progress Notes (Signed)
The patient was referred by Dr. Tenny Craw because of issues related to major stressors. The patient reports right now she has been very stressed because her father is in the ICU. He has been there for the past 5 days. This is due to alcohol detox as well as heart issues. The patient reports that no other family members will help him and that he calls the patient 30 times a day. She reports that her son is abusing heroin at this time as well as a past history of opiate pill abuse. The patient reports that she will try to get a better handle on her anxiety. We set the patient up for ongoing individual psychotherapeutic interventions.

## 2014-10-04 ENCOUNTER — Emergency Department (HOSPITAL_COMMUNITY): Payer: 59

## 2014-10-04 ENCOUNTER — Encounter (HOSPITAL_COMMUNITY): Payer: Self-pay | Admitting: *Deleted

## 2014-10-04 ENCOUNTER — Encounter (HOSPITAL_COMMUNITY): Payer: Self-pay | Admitting: Emergency Medicine

## 2014-10-04 ENCOUNTER — Emergency Department (HOSPITAL_COMMUNITY)
Admission: EM | Admit: 2014-10-04 | Discharge: 2014-10-04 | Disposition: A | Discharge status: Home / Self Care | Payer: 59 | Source: Home / Self Care | Attending: Emergency Medicine | Admitting: Emergency Medicine

## 2014-10-04 ENCOUNTER — Inpatient Hospital Stay (HOSPITAL_COMMUNITY)
Admission: EM | Admit: 2014-10-04 | Discharge: 2014-10-08 | DRG: 392 | Disposition: A | Discharge status: Home / Self Care | Payer: 59 | Attending: Internal Medicine | Admitting: Internal Medicine

## 2014-10-04 DIAGNOSIS — G8929 Other chronic pain: Secondary | ICD-10-CM | POA: Diagnosis present

## 2014-10-04 DIAGNOSIS — K5900 Constipation, unspecified: Secondary | ICD-10-CM

## 2014-10-04 DIAGNOSIS — A09 Infectious gastroenteritis and colitis, unspecified: Secondary | ICD-10-CM | POA: Diagnosis not present

## 2014-10-04 DIAGNOSIS — K529 Noninfective gastroenteritis and colitis, unspecified: Secondary | ICD-10-CM | POA: Diagnosis not present

## 2014-10-04 DIAGNOSIS — Z8249 Family history of ischemic heart disease and other diseases of the circulatory system: Secondary | ICD-10-CM

## 2014-10-04 DIAGNOSIS — Z720 Tobacco use: Secondary | ICD-10-CM | POA: Diagnosis present

## 2014-10-04 DIAGNOSIS — R112 Nausea with vomiting, unspecified: Secondary | ICD-10-CM

## 2014-10-04 DIAGNOSIS — M549 Dorsalgia, unspecified: Secondary | ICD-10-CM

## 2014-10-04 DIAGNOSIS — E876 Hypokalemia: Secondary | ICD-10-CM | POA: Diagnosis not present

## 2014-10-04 DIAGNOSIS — E039 Hypothyroidism, unspecified: Secondary | ICD-10-CM | POA: Diagnosis present

## 2014-10-04 DIAGNOSIS — F419 Anxiety disorder, unspecified: Secondary | ICD-10-CM | POA: Diagnosis present

## 2014-10-04 DIAGNOSIS — M545 Low back pain: Secondary | ICD-10-CM | POA: Diagnosis not present

## 2014-10-04 DIAGNOSIS — F17210 Nicotine dependence, cigarettes, uncomplicated: Secondary | ICD-10-CM | POA: Diagnosis present

## 2014-10-04 DIAGNOSIS — F329 Major depressive disorder, single episode, unspecified: Secondary | ICD-10-CM | POA: Diagnosis present

## 2014-10-04 DIAGNOSIS — Z833 Family history of diabetes mellitus: Secondary | ICD-10-CM

## 2014-10-04 DIAGNOSIS — R1110 Vomiting, unspecified: Secondary | ICD-10-CM | POA: Diagnosis present

## 2014-10-04 LAB — CBC WITH DIFFERENTIAL/PLATELET
BASOS ABS: 0 10*3/uL (ref 0.0–0.1)
Basophils Relative: 0 %
EOS ABS: 0.1 10*3/uL (ref 0.0–0.7)
EOS PCT: 1 %
HCT: 41.1 % (ref 36.0–46.0)
Hemoglobin: 14.7 g/dL (ref 12.0–15.0)
Lymphocytes Relative: 21 %
Lymphs Abs: 2 10*3/uL (ref 0.7–4.0)
MCH: 32.3 pg (ref 26.0–34.0)
MCHC: 35.8 g/dL (ref 30.0–36.0)
MCV: 90.3 fL (ref 78.0–100.0)
Monocytes Absolute: 0.7 10*3/uL (ref 0.1–1.0)
Monocytes Relative: 7 %
Neutro Abs: 6.8 10*3/uL (ref 1.7–7.7)
Neutrophils Relative %: 71 %
Platelets: 438 10*3/uL — ABNORMAL HIGH (ref 150–400)
RBC: 4.55 MIL/uL (ref 3.87–5.11)
RDW: 13.1 % (ref 11.5–15.5)
WBC: 9.6 10*3/uL (ref 4.0–10.5)

## 2014-10-04 LAB — URINALYSIS, ROUTINE W REFLEX MICROSCOPIC
Bilirubin Urine: NEGATIVE
GLUCOSE, UA: NEGATIVE mg/dL
Ketones, ur: 40 mg/dL — AB
LEUKOCYTES UA: NEGATIVE
NITRITE: NEGATIVE
PROTEIN: NEGATIVE mg/dL
Specific Gravity, Urine: 1.02 (ref 1.005–1.030)
Urobilinogen, UA: 0.2 mg/dL (ref 0.0–1.0)
pH: 8 (ref 5.0–8.0)

## 2014-10-04 LAB — COMPREHENSIVE METABOLIC PANEL
ALT: 17 U/L (ref 14–54)
AST: 23 U/L (ref 15–41)
Albumin: 4.5 g/dL (ref 3.5–5.0)
Alkaline Phosphatase: 42 U/L (ref 38–126)
Anion gap: 11 (ref 5–15)
BUN: 13 mg/dL (ref 6–20)
CHLORIDE: 107 mmol/L (ref 101–111)
CO2: 20 mmol/L — AB (ref 22–32)
CREATININE: 0.66 mg/dL (ref 0.44–1.00)
Calcium: 9.4 mg/dL (ref 8.9–10.3)
GFR calc non Af Amer: >60 mL/min (ref >60)
Glucose, Bld: 118 mg/dL — ABNORMAL HIGH (ref 65–99)
POTASSIUM: 3.1 mmol/L — AB (ref 3.5–5.1)
SODIUM: 138 mmol/L (ref 135–145)
Total Bilirubin: 0.6 mg/dL (ref 0.3–1.2)
Total Protein: 7.4 g/dL (ref 6.5–8.1)

## 2014-10-04 LAB — PREGNANCY, URINE: PREG TEST UR: NEGATIVE

## 2014-10-04 LAB — LIPASE, BLOOD: Lipase: 13 U/L — ABNORMAL LOW (ref 22–51)

## 2014-10-04 LAB — URINE MICROSCOPIC-ADD ON

## 2014-10-04 MED ORDER — MORPHINE SULFATE (PF) 2 MG/ML IV SOLN
2.0000 mg | INTRAVENOUS | Status: DC | PRN
  Administered 2014-10-04 – 2014-10-06 (×6): 2 mg via INTRAVENOUS
  Filled 2014-10-04 (×8): qty 1

## 2014-10-04 MED ORDER — ONDANSETRON HCL 4 MG/2ML IJ SOLN
4.0000 mg | Freq: Once | INTRAMUSCULAR | Status: AC
  Administered 2014-10-04: 4 mg via INTRAVENOUS
  Filled 2014-10-04: qty 2

## 2014-10-04 MED ORDER — HYDROMORPHONE HCL 1 MG/ML IJ SOLN
1.0000 mg | Freq: Once | INTRAMUSCULAR | Status: AC
  Administered 2014-10-04: 1 mg via INTRAVENOUS
  Filled 2014-10-04: qty 1

## 2014-10-04 MED ORDER — CIPROFLOXACIN IN D5W 400 MG/200ML IV SOLN
400.0000 mg | Freq: Two times a day (BID) | INTRAVENOUS | Status: DC
  Administered 2014-10-05 – 2014-10-07 (×6): 400 mg via INTRAVENOUS
  Filled 2014-10-04 (×6): qty 200

## 2014-10-04 MED ORDER — ONDANSETRON 4 MG PO TBDP: ORAL_TABLET | ORAL | Status: DC

## 2014-10-04 MED ORDER — MORPHINE SULFATE (PF) 2 MG/ML IV SOLN
2.0000 mg | Freq: Once | INTRAVENOUS | Status: AC
  Administered 2014-10-04: 2 mg via INTRAVENOUS
  Filled 2014-10-04: qty 1

## 2014-10-04 MED ORDER — HYDROCODONE-ACETAMINOPHEN 5-325 MG PO TABS
1.0000 | ORAL_TABLET | ORAL | Status: DC | PRN
Start: 2014-10-04 — End: 2014-10-08
  Administered 2014-10-05 – 2014-10-08 (×6): 1 via ORAL
  Filled 2014-10-04 (×7): qty 1

## 2014-10-04 MED ORDER — PROMETHAZINE HCL 25 MG/ML IJ SOLN
12.5000 mg | Freq: Once | INTRAMUSCULAR | Status: AC
  Administered 2014-10-04: 12.5 mg via INTRAVENOUS
  Filled 2014-10-04: qty 1

## 2014-10-04 MED ORDER — SODIUM CHLORIDE 0.9 % IV BOLUS (SEPSIS)
1000.0000 mL | Freq: Once | INTRAVENOUS | Status: AC
  Administered 2014-10-04: 1000 mL via INTRAVENOUS

## 2014-10-04 MED ORDER — POTASSIUM CHLORIDE IN NACL 20-0.9 MEQ/L-% IV SOLN
INTRAVENOUS | Status: DC
  Administered 2014-10-04 – 2014-10-05 (×2): via INTRAVENOUS

## 2014-10-04 MED ORDER — TRAMADOL HCL 50 MG PO TABS: 50.0000 mg | ORAL_TABLET | Freq: Four times a day (QID) | ORAL | Status: DC | PRN

## 2014-10-04 MED ORDER — BISACODYL 10 MG RE SUPP: 10.0000 mg | RECTAL | Status: DC | PRN

## 2014-10-04 MED ORDER — CITALOPRAM HYDROBROMIDE 20 MG PO TABS
10.0000 mg | ORAL_TABLET | Freq: Every day | ORAL | Status: DC
  Administered 2014-10-06 – 2014-10-08 (×2): 10 mg via ORAL
  Filled 2014-10-04 (×5): qty 1

## 2014-10-04 MED ORDER — CIPROFLOXACIN IN D5W 400 MG/200ML IV SOLN
400.0000 mg | Freq: Once | INTRAVENOUS | Status: AC
  Administered 2014-10-04: 400 mg via INTRAVENOUS
  Filled 2014-10-04: qty 200

## 2014-10-04 MED ORDER — METRONIDAZOLE IN NACL 5-0.79 MG/ML-% IV SOLN
500.0000 mg | Freq: Three times a day (TID) | INTRAVENOUS | Status: DC
  Administered 2014-10-05 – 2014-10-07 (×8): 500 mg via INTRAVENOUS
  Filled 2014-10-04 (×8): qty 100

## 2014-10-04 MED ORDER — ENOXAPARIN SODIUM 40 MG/0.4ML ~~LOC~~ SOLN
40.0000 mg | SUBCUTANEOUS | Status: DC
  Administered 2014-10-05 – 2014-10-06 (×2): 40 mg via SUBCUTANEOUS
  Filled 2014-10-04 (×2): qty 0.4

## 2014-10-04 MED ORDER — PROMETHAZINE HCL 25 MG/ML IJ SOLN
25.0000 mg | Freq: Once | INTRAMUSCULAR | Status: AC
  Administered 2014-10-04: 25 mg via INTRAVENOUS
  Filled 2014-10-04: qty 1

## 2014-10-04 MED ORDER — ONDANSETRON HCL 4 MG/2ML IJ SOLN: 4.0000 mg | Freq: Three times a day (TID) | INTRAMUSCULAR | Status: DC | PRN

## 2014-10-04 MED ORDER — ONDANSETRON HCL 4 MG PO TABS: 4.0000 mg | ORAL_TABLET | Freq: Four times a day (QID) | ORAL | Status: DC | PRN

## 2014-10-04 MED ORDER — IOHEXOL 300 MG/ML  SOLN
100.0000 mL | Freq: Once | INTRAMUSCULAR | Status: AC | PRN
  Administered 2014-10-04: 100 mL via INTRAVENOUS

## 2014-10-04 MED ORDER — METRONIDAZOLE 500 MG PO TABS
500.0000 mg | ORAL_TABLET | Freq: Once | ORAL | Status: AC
  Administered 2014-10-04: 500 mg via ORAL
  Filled 2014-10-04: qty 1

## 2014-10-04 MED ORDER — ALPRAZOLAM 0.5 MG PO TABS
0.5000 mg | ORAL_TABLET | Freq: Two times a day (BID) | ORAL | Status: DC | PRN
  Administered 2014-10-05 – 2014-10-08 (×7): 0.5 mg via ORAL
  Filled 2014-10-04 (×7): qty 1

## 2014-10-04 MED ORDER — KETOROLAC TROMETHAMINE 30 MG/ML IJ SOLN
30.0000 mg | Freq: Once | INTRAMUSCULAR | Status: AC
  Administered 2014-10-04: 30 mg via INTRAVENOUS
  Filled 2014-10-04: qty 1

## 2014-10-04 MED ORDER — SODIUM CHLORIDE 0.9 % IV SOLN
INTRAVENOUS | Status: AC
  Administered 2014-10-04: 19:00:00 via INTRAVENOUS

## 2014-10-04 MED ORDER — LORAZEPAM 2 MG/ML IJ SOLN
0.5000 mg | Freq: Once | INTRAMUSCULAR | Status: AC
  Administered 2014-10-04: 0.5 mg via INTRAVENOUS
  Filled 2014-10-04: qty 1

## 2014-10-04 MED ORDER — ONDANSETRON HCL 4 MG/2ML IJ SOLN
4.0000 mg | Freq: Four times a day (QID) | INTRAMUSCULAR | Status: DC | PRN
  Administered 2014-10-04 – 2014-10-07 (×9): 4 mg via INTRAVENOUS
  Filled 2014-10-04 (×9): qty 2

## 2014-10-04 NOTE — Progress Notes (Signed)
1856 Patient rolling around moaning in bed c/o severe ABD pain and nausea. No PRN pain med currently ordered. MD notified.

## 2014-10-04 NOTE — H&P (Signed)
[facility] History and Physical  Sheri Vargas ZOX:[id number] DOB: [birth year] DOA: 10/04/2014  Referring physician: ER PCP: Sheri Pizza, MD   Chief Complaint: Abdominal pain. Nausea and vomiting  HPI: Sheri Vargas is a 40 y.o. female  This is a 39 year old lady who had been constipated for the last week or so and yesterday took over-the-counter laxity. Last night she became nauseous and started vomiting and experienced generalized abdominal pain. There was no hematemesis. There was no rectal bleeding. There is no fever. Evaluation in the emergency room with a CT scan shows that she has colitis affecting the sigmoid colon. She is now being admitted for further management.   Review of Systems:  Apart from symptoms above, all systems negative.   Past Medical History  Diagnosis Date  . Anxiety   . Degenerative disc disease   . Depression   . Ectopic pregnancy, tubal     5 ectopic pregnancies  . Kidney stones   . Chronic back pain 06/17/2012  . Menorrhagia 06/17/2012  . Decreased libido 06/17/2012  . Thyroid disease   . Hypothyroid 06/25/2012    TSH 5.561, will rx synthroid 25 mcg and recheck in 8 weeks   Past Surgical History  Procedure Laterality Date  . Cholecystectomy  2004  . Back surgery      cyst removed  . Ectopic pregnancy surgery     Social History:  reports that she has been smoking Cigarettes.  She has a 20 pack-year smoking history. She has never used smokeless tobacco. She reports that she does not drink alcohol or use illicit drugs.  Allergies  Allergen Reactions  . Latex     Family History  Problem Relation Age of Onset  . Hypertension Father   . Hypertension Paternal Uncle   . Diabetes Paternal Grandmother   . Hypertension Paternal Uncle   . Kidney Stones Brother   . Hypertension Son     Prior to Admission medications   Medication Sig Start Date End Date Taking? Authorizing Provider  ALPRAZolam Prudy Feeler) 0.5 MG tablet Take 0.5 mg by mouth  2 (two) times daily as needed for anxiety.  08/04/14  Yes Historical Provider, MD  bisacodyl (LAXATIVE) 10 MG suppository Place 10 mg rectally as needed for moderate constipation.   Yes Historical Provider, MD  citalopram (CELEXA) 10 MG tablet Take 10 mg by mouth daily.   Yes Historical Provider, MD  HYDROcodone-acetaminophen (NORCO/VICODIN) 5-325 MG per tablet 1 or 2 tabs PO q6 hours prn pain 12/29/13  Yes Samuel Jester, DO  ibuprofen (ADVIL,MOTRIN) 200 MG tablet Take 800 mg by mouth every 6 (six) hours as needed. For pain   Yes Historical Provider, MD  ondansetron (ZOFRAN ODT) 4 MG disintegrating tablet  ODT q4 hours prn nausea/vomit Patient taking differently: Take 4 mg by mouth every 4 (four) hours as needed for nausea or vomiting.  ODT q4 hours prn nausea/vomit 10/04/14  Yes Bethann Berkshire, MD  traMADol (ULTRAM) 50 MG tablet Take 1 tablet (50 mg total) by mouth every 6 (six) hours as needed. 10/04/14  Yes Bethann Berkshire, MD   Physical Exam: Filed Vitals:   10/04/14 1523 10/04/14 1717  BP: 123/71 113/78  Pulse: 106 79  Temp: 97.8 F (36.6 C) 97.9 F (36.6 C)  TempSrc: Oral Oral  Resp: 22 23  SpO2: 95% 100%    Wt Readings from Last 3 Encounters:  10/04/14 64.864 kg (143 lb)  12/29/13 64.411 kg (142 lb)  03/06/13 65.454 kg (144 lb 4.8  oz)    General:  Appears in pain. She is not toxic or septic. She is not clinically dehydrated. She is somewhat histrionic. Eyes: PERRL, normal lids, irises & conjunctiva ENT: grossly normal hearing, lips & tongue Neck: no LAD, masses or thyromegaly Cardiovascular: RRR, no m/r/g. No LE edema. Telemetry: SR, no arrhythmias  Respiratory: CTA bilaterally, no w/r/r. Normal respiratory effort. Abdomen: soft, subjective tenderness in generalized fashion. Bowel sounds are present. Skin: no rash or induration seen on limited exam Musculoskeletal: grossly normal tone BUE/BLE Psychiatric: grossly normal mood and affect, speech fluent and  appropriate Neurologic: grossly non-focal.          Labs on Admission:  Basic Metabolic Panel:  Recent Labs Lab 10/04/14 0730  NA 138  K 3.1*  CL 107  CO2 20*  GLUCOSE 118*  BUN 13  CREATININE 0.66  CALCIUM 9.4   Liver Function Tests:  Recent Labs Lab 10/04/14 0730  AST 23  ALT 17  ALKPHOS 42  BILITOT 0.6  PROT 7.4  ALBUMIN 4.5    Recent Labs Lab 10/04/14 0730  LIPASE 13*   No results for input(s): AMMONIA in the last 168 hours. CBC:  Recent Labs Lab 10/04/14 0730  WBC 9.6  NEUTROABS 6.8  HGB 14.7  HCT 41.1  MCV 90.3  PLT 438*   Cardiac Enzymes: No results for input(s): CKTOTAL, CKMB, CKMBINDEX, TROPONINI in the last 168 hours.  BNP (last 3 results) No results for input(s): BNP in the last 8760 hours.  ProBNP (last 3 results) No results for input(s): PROBNP in the last 8760 hours.  CBG: No results for input(s): GLUCAP in the last 168 hours.  Radiological Exams on Admission: Ct Abdomen Pelvis W Contrast  10/04/2014   CLINICAL DATA:  Constipation and abdomen pain for 1 week.  EXAM: CT ABDOMEN AND PELVIS WITH CONTRAST  TECHNIQUE: Multidetector CT imaging of the abdomen and pelvis was performed using the standard protocol following bolus administration of intravenous contrast.  CONTRAST:  OMNIPAQUE IOHEXOL 300 MG/ML  SOLN  COMPARISON:  December 29, 2013  FINDINGS: The liver, spleen, pancreas, adrenal glands and kidneys are normal. The patient is status post prior cholecystectomy. Atherosclerosis of the abdominal aorta is identified without aneurysmal dilatation. There is no abdominal lymphadenopathy.  There is no small bowel obstruction. There is minimal hiatal hernia. There is mild diffuse bowel wall thickening of the sigmoid colon with minimal surrounding stranding. The appendix is not seen but no inflammation is noted around cecum. Mild to moderate bowel content is identified throughout colon.  Fluid-filled bladder is normal. The uterus is normal.  There are small cysts in normal size bilateral ovaries. The lung bases are clear. No acute abnormality is identified within the visualized bones.  IMPRESSION: Mild diffuse bowel wall thickening of the sigmoid colon with minimal surrounding stranding. This is nonspecific but can be due to colitis, either infectious or inflammatory.   Electronically Signed   By: Sherian Rein M.D.   On: 10/04/2014 16:48   Dg Abd Acute W/chest  10/04/2014   CLINICAL DATA:  Constipation for 1-2 weeks. Left lower quadrant abdominal pain, nausea, vomiting.  EXAM: DG ABDOMEN ACUTE W/ 1V CHEST  COMPARISON:  09/07/2012  FINDINGS: Prior cholecystectomy. Moderate stool throughout the colon. The bowel gas pattern is normal. There is no evidence of free intraperitoneal air. No suspicious radio-opaque calculi or other significant radiographic abnormality is seen. Heart size and mediastinal contours are within normal limits. Both lungs are clear.  IMPRESSION: No  evidence of bowel obstruction or free air. Moderate stool burden.  No active cardiopulmonary disease.   Electronically Signed   By: Charlett Nose M.D.   On: 10/04/2014 11:52      Assessment/Plan   1. Colitis. She will be treated with intravenous antibiotics, IV fluids, analgesia and antiemetics as required. 2. Anxiety. Continue with home medication of Xanax.  Further recommendations will depend on patient's hospital progress.  Code Status: Full code.   DVT Prophylaxis: Lovenox.  Family Communication: I discussed the plan with the patient at the bedside.   Disposition Plan: Home when medically stable.   Time spent: 45 minutes.  Wilson Singer [facility] Pager [phone].

## 2014-10-04 NOTE — ED Notes (Signed)
Patient c/o nausea and pain. Pain medication is "not working" per patient. Also requesting PO fluids at same time. MD advised. Verbal order for 4 mg Zofran IV obtained and verbal order to PO Challenge patient.

## 2014-10-04 NOTE — ED Notes (Signed)
Patient requesting pain medication. MD advised. Verbal order for 30 mg toradol IV obtained.

## 2014-10-04 NOTE — ED Notes (Signed)
Pt c/o constipation x 1 week with n/v since last night. Pt states she took otc laxatives yesterday with minimal results this am.

## 2014-10-04 NOTE — ED Provider Notes (Signed)
CSN: [id number]     Arrival date & time 10/04/14  0645 History   First MD Initiated Contact with Patient 10/04/14 [phone]     Chief Complaint  Patient presents with  . Abdominal Pain     (Consider location/radiation/quality/duration/timing/severity/associated sxs/prior Treatment) Patient is a 39 y.o. female presenting with abdominal pain. The history is provided by the patient (Patient complains of abdominal pain with constipation some nausea. For a few days).  Abdominal Pain Pain location:  Generalized Pain quality: aching   Pain radiates to:  Does not radiate Pain severity:  Moderate Onset quality:  Gradual Timing:  Constant Progression:  Worsening Chronicity:  New Associated symptoms: nausea   Associated symptoms: no chest pain, no cough, no diarrhea, no fatigue and no hematuria     Past Medical History  Diagnosis Date  . Anxiety   . Degenerative disc disease   . Depression   . Ectopic pregnancy, tubal     5 ectopic pregnancies  . Kidney stones   . Chronic back pain 06/17/2012  . Menorrhagia 06/17/2012  . Decreased libido 06/17/2012  . Thyroid disease   . Hypothyroid 06/25/2012    TSH 5.561, will rx synthroid 25 mcg and recheck in 8 weeks   Past Surgical History  Procedure Laterality Date  . Cholecystectomy  2004  . Back surgery      cyst removed  . Ectopic pregnancy surgery     Family History  Problem Relation Age of Onset  . Hypertension Father   . Hypertension Paternal Uncle   . Diabetes Paternal Grandmother   . Hypertension Paternal Uncle   . Kidney Stones Brother   . Hypertension Son    Social History  Substance Use Topics  . Smoking status: Current Every Day Smoker -- 1.00 packs/day for 20 years    Types: Cigarettes    Last Attempt to Quit: 01/20/1994  . Smokeless tobacco: Never Used  . Alcohol Use: No   OB History    Gravida Para Term Preterm AB TAB SAB Ectopic Multiple Living   6 1   5   5  1      Review of Systems  Constitutional: Negative for  appetite change and fatigue.  HENT: Negative for congestion, ear discharge and sinus pressure.   Eyes: Negative for discharge.  Respiratory: Negative for cough.   Cardiovascular: Negative for chest pain.  Gastrointestinal: Positive for nausea and abdominal pain. Negative for diarrhea.  Genitourinary: Negative for frequency and hematuria.  Musculoskeletal: Negative for back pain.  Skin: Negative for rash.  Neurological: Negative for seizures and headaches.  Psychiatric/Behavioral: Negative for hallucinations.      Allergies  Latex  Home Medications   Prior to Admission medications   Medication Sig Start Date End Date Taking? Authorizing Provider  ALPRAZolam Prudy Feeler) 0.5 MG tablet Take 0.5 mg by mouth 2 (two) times daily as needed for anxiety.  08/04/14  Yes Historical Provider, MD  bisacodyl (LAXATIVE) 10 MG suppository Place 10 mg rectally as needed for moderate constipation.   Yes Historical Provider, MD  citalopram (CELEXA) 10 MG tablet Take 10 mg by mouth daily.   Yes Historical Provider, MD  HYDROcodone-acetaminophen (NORCO/VICODIN) 5-325 MG per tablet 1 or 2 tabs PO q6 hours prn pain 12/29/13  Yes Samuel Jester, DO  ibuprofen (ADVIL,MOTRIN) 200 MG tablet Take 800 mg by mouth every 6 (six) hours as needed. For pain   Yes Historical Provider, MD  ondansetron (ZOFRAN ODT) 4 MG disintegrating tablet 4mg  ODT q4 hours prn  nausea/vomit 10/04/14   Bethann Berkshire, MD  traMADol (ULTRAM) 50 MG tablet Take 1 tablet (50 mg total) by mouth every 6 (six) hours as needed. 10/04/14   Bethann Berkshire, MD   BP 131/88 mmHg  Pulse 58  Temp(Src) 98.5 F (36.9 C)  Resp 18  Ht  (1.6 m)  Wt 143 lb (64.864 kg)  BMI 25.34 kg/m2  SpO2 100%  LMP 09/03/2014 Physical Exam  Constitutional: She is oriented to person, place, and time. She appears well-developed.  HENT:  Head: Normocephalic.  Eyes: Conjunctivae and EOM are normal. No scleral icterus.  Neck: Neck supple. No thyromegaly present.   Cardiovascular: Normal rate and regular rhythm.  Exam reveals no gallop and no friction rub.   No murmur heard. Pulmonary/Chest: No stridor. She has no wheezes. She has no rales. She exhibits no tenderness.  Abdominal: She exhibits no distension. There is tenderness. There is no rebound.  Mild tenderness throughout  Musculoskeletal: Normal range of motion. She exhibits no edema.  Lymphadenopathy:    She has no cervical adenopathy.  Neurological: She is oriented to person, place, and time. She exhibits normal muscle tone. Coordination normal.  Skin: No rash noted. No erythema.  Psychiatric: She has a normal mood and affect. Her behavior is normal.    ED Course  Procedures (including critical care time) Labs Review Labs Reviewed  CBC WITH DIFFERENTIAL/PLATELET - Abnormal; Notable for the following:    Platelets 438 (*)    All other components within normal limits  COMPREHENSIVE METABOLIC PANEL - Abnormal; Notable for the following:    Potassium 3.1 (*)    CO2 20 (*)    Glucose, Bld 118 (*)    All other components within normal limits  LIPASE, BLOOD - Abnormal; Notable for the following:    Lipase 13 (*)    All other components within normal limits  URINALYSIS, ROUTINE W REFLEX MICROSCOPIC (NOT AT [facility]) - Abnormal; Notable for the following:    Hgb urine dipstick TRACE (*)    Ketones, ur 40 (*)    All other components within normal limits  URINE MICROSCOPIC-ADD ON - Abnormal; Notable for the following:    Squamous Epithelial / LPF FEW (*)    Bacteria, UA MANY (*)    All other components within normal limits  PREGNANCY, URINE    Imaging Review Dg Abd Acute W/chest  10/04/2014   CLINICAL DATA:  Constipation for 1-2 weeks. Left lower quadrant abdominal pain, nausea, vomiting.  EXAM: DG ABDOMEN ACUTE W/ 1V CHEST  COMPARISON:  09/07/2012  FINDINGS: Prior cholecystectomy. Moderate stool throughout the colon. The bowel gas pattern is normal. There is no evidence of free  intraperitoneal air. No suspicious radio-opaque calculi or other significant radiographic abnormality is seen. Heart size and mediastinal contours are within normal limits. Both lungs are clear.  IMPRESSION: No evidence of bowel obstruction or free air. Moderate stool burden.  No active cardiopulmonary disease.   Electronically Signed   By: Charlett Nose M.D.   On: 10/04/2014 11:52   I have personally reviewed and evaluated these images and lab results as part of my medical decision-making.   EKG Interpretation None      MDM   Final diagnoses:  Constipation, unspecified constipation type   labs unremarkable. X-rays consistent with constipation. Will treat patient with mag citrate and Zofran and she will follow-up with her doctor  Bethann Berkshire, MD 10/04/14 1221

## 2014-10-04 NOTE — ED Notes (Signed)
Pt seen here this morning, pt states that she is feeling worse.  Dx with constipation earlier and had a BM

## 2014-10-04 NOTE — Discharge Instructions (Signed)
Drink 1/2 bottle of mg citrate if no bowel movement then repeat and drink another 1/2 bottle.  Follow up with your md if not improving.

## 2014-10-04 NOTE — ED Provider Notes (Signed)
CSN: [id number]     Arrival date & time 10/04/14    1517 History   First MD Initiated Contact with Patient 10/04/14 1534     Chief Complaint  Patient presents with  . Emesis     (Consider location/radiation/quality/duration/timing/severity/associated sxs/prior Treatment) HPI Comments: The pa she has had over-the-counter laxity of which she took last night, had increased nausea and has been vomiting throughout the night and the day, was seen in the emergency department this morning and had x-rays of the abdomen as well as laboratory workup and medications which improved her symptoms, she was discharged with Zofran and instructed to take magnesium citrate. She took the antinausea medication, states it has not worked and she has been throwing up for the last several hours and persistently nauseated. She denies fevers or chills, denies any blood in her stools and states that she did have a stool today but her symptoms did not go away.tient is a 39 year old female, she has a history of cholecystitis in the past as well as a history of chronic constipation. She reports that she has not had a bowel movement in well over one week,  Patient is a 39 y.o. female presenting with vomiting. The history is provided by the patient.  Emesis   Past Medical History  Diagnosis Date  . Anxiety   . Degenerative disc disease   . Depression   . Ectopic pregnancy, tubal     5 ectopic pregnancies  . Kidney stones   . Chronic back pain 06/17/2012  . Menorrhagia 06/17/2012  . Decreased libido 06/17/2012  . Thyroid disease   . Hypothyroid 06/25/2012    TSH 5.561, will rx synthroid 25 mcg and recheck in 8 weeks   Past Surgical History  Procedure Laterality Date  . Cholecystectomy  2004  . Back surgery      cyst removed  . Ectopic pregnancy surgery     Family History  Problem Relation Age of Onset  . Hypertension Father   . Hypertension Paternal Uncle   . Diabetes Paternal Grandmother   . Hypertension  Paternal Uncle   . Kidney Stones Brother   . Hypertension Son    Social History  Substance Use Topics  . Smoking status: Current Every Day Smoker -- 1.00 packs/day for 20 years    Types: Cigarettes    Last Attempt to Quit: 01/20/1994  . Smokeless tobacco: Never Used  . Alcohol Use: No   OB History    Gravida Para Term Preterm AB TAB SAB Ectopic Multiple Living   Review of Systems  Gastrointestinal: Positive for vomiting.  All other systems reviewed and are negative.     Allergies  Latex  Home Medications   Prior to Admission medications   Medication Sig Start Date End Date Taking? Authorizing Provider  ALPRAZolam Prudy Feeler) 0.5 MG tablet Take 0.5 mg by mouth 2 (two) times daily as needed for anxiety.  08/04/14  Yes Historical Provider, MD  bisacodyl (LAXATIVE) 10 MG suppository Place 10 mg rectally as needed for moderate constipation.   Yes Historical Provider, MD  citalopram (CELEXA) 10 MG tablet Take 10 mg by mouth daily.   Yes Historical Provider, MD  HYDROcodone-acetaminophen (NORCO/VICODIN) 5-325 MG per tablet 1 or 2 tabs PO q6 hours prn pain 12/29/13  Yes Samuel Jester, DO  ibuprofen (ADVIL,MOTRIN) 200 MG tablet Take 800 mg by mouth every 6 (six) hours as needed. For pain  Yes Historical Provider, MD  ondansetron (ZOFRAN ODT) 4 MG disintegrating tablet 4mg  ODT q4 hours prn nausea/vomit Patient taking differently: Take 4 mg by mouth every 4 (four) hours as needed for nausea or vomiting. 4mg  ODT q4 hours prn nausea/vomit 10/04/14  Yes Bethann Berkshire, MD  traMADol (ULTRAM) 50 MG tablet Take 1 tablet (50 mg total) by mouth every 6 (six) hours as needed. 10/04/14  Yes Bethann Berkshire, MD   BP 113/78 mmHg  Pulse 79  Temp(Src) 97.9 F (36.6 C) (Oral)  Resp 23  SpO2 100%  LMP 09/03/2014 Physical Exam  Constitutional: She appears well-developed and well-nourished.  Uncomfortable appearing, groaning  HENT:  Head: Normocephalic and atraumatic.   Mouth/Throat: Oropharynx is clear and moist. No oropharyngeal exudate.  Eyes: Conjunctivae and EOM are normal. Pupils are equal, round, and reactive to light. Right eye exhibits no discharge. Left eye exhibits no discharge. No scleral icterus.  Neck: Normal range of motion. Neck supple. No JVD present. No thyromegaly present.  Cardiovascular: Regular rhythm, normal heart sounds and intact distal pulses.  Exam reveals no gallop and no friction rub.   No murmur heard. Mild tachycardia  Pulmonary/Chest: Effort normal and breath sounds normal. No respiratory distress. She has no wheezes. She has no rales.  Abdominal: Soft. Bowel sounds are normal. She exhibits no distension and no mass. There is tenderness (diffuse tenderness, increased on the left side compared to the right side, no peritoneal signs, no distention).  Musculoskeletal: Normal range of motion. She exhibits no edema or tenderness.  Lymphadenopathy:    She has no cervical adenopathy.  Neurological: She is alert. Coordination normal.  Skin: Skin is warm and dry. No rash noted. No erythema.  Psychiatric: She has a normal mood and affect. Her behavior is normal.  Nursing note and vitals reviewed.   ED Course  Procedures (including critical care time) Labs Review Labs Reviewed - No data to display  Imaging Review Ct Abdomen Pelvis W Contrast  10/04/2014   CLINICAL DATA:  Constipation and abdomen pain for 1 week.  EXAM: CT ABDOMEN AND PELVIS WITH CONTRAST  TECHNIQUE: Multidetector CT imaging of the abdomen and pelvis was performed using the standard protocol following bolus administration of intravenous contrast.  CONTRAST:  OMNIPAQUE IOHEXOL 300 MG/ML  SOLN  COMPARISON:  December 29, 2013  FINDINGS: The liver, spleen, pancreas, adrenal glands and kidneys are normal. The patient is status post prior cholecystectomy. Atherosclerosis of the abdominal aorta is identified without aneurysmal dilatation. There is no abdominal  lymphadenopathy.  There is no small bowel obstruction. There is minimal hiatal hernia. There is mild diffuse bowel wall thickening of the sigmoid colon with minimal surrounding stranding. The appendix is not seen but no inflammation is noted around cecum. Mild to moderate bowel content is identified throughout colon.  Fluid-filled bladder is normal. The uterus is normal. There are small cysts in normal size bilateral ovaries. The lung bases are clear. No acute abnormality is identified within the visualized bones.  IMPRESSION: Mild diffuse bowel wall thickening of the sigmoid colon with minimal surrounding stranding. This is nonspecific but can be due to colitis, either infectious or inflammatory.   Electronically Signed   By: Sherian Rein M.D.   On: 10/04/2014 16:48   Dg Abd Acute W/chest  10/04/2014   CLINICAL DATA:  Constipation for 1-2 weeks. Left lower quadrant abdominal pain, nausea, vomiting.  EXAM: DG ABDOMEN ACUTE W/ 1V CHEST  COMPARISON:  09/07/2012  FINDINGS: Prior cholecystectomy. Moderate stool throughout  the colon. The bowel gas pattern is normal. There is no evidence of free intraperitoneal air. No suspicious radio-opaque calculi or other significant radiographic abnormality is seen. Heart size and mediastinal contours are within normal limits. Both lungs are clear.  IMPRESSION: No evidence of bowel obstruction or free air. Moderate stool burden.  No active cardiopulmonary disease.   Electronically Signed   By: Charlett Nose M.D.   On: 10/04/2014 11:52   I have personally reviewed and evaluated these images and lab results as part of my medical decision-making.    MDM   Final diagnoses:  Non-intractable vomiting with nausea, vomiting of unspecified type  Colitis    The patient appears very uncomfortable, she is persistently nauseated, review of her acute abdominal series this morning showed a moderate stool burden but no other acute abnormalities.in light of her ongoing symptoms and  seemingly lack of improvement with outpatient medications I will perform a CT scan and give symptomatic medications, no indication for repeating lab work as it was just done in the last 10 hours. The patient does not have a surgical abdomen at this time, would consider diverticulitis, bowel obstruction, appendicitis as potential alternative etiologies to medication reaction or constipation. The patient is in agreement with this workup and plan.  Has ongoing nausea and vomiting, labs reviewed from earlier visit, no acute findings, CT scan reviewed, shows sigmoid inflammatory illness, IV fluids and antibiotics without improvement, will need to be admitted for lack of ability to tolerate oral medications  The patient has not tolerated her medications, I have discussed with the hospitalist who will admit. Dr. Karilyn Cota - requests holding orderes  Meds given in ED:  Medications  ciprofloxacin (CIPRO) IVPB 400 mg (400 mg Intravenous New Bag/Given 10/04/14 1711)  promethazine (PHENERGAN) injection 25 mg (25 mg Intravenous Given 10/04/14 1601)  sodium chloride 0.9 % bolus 1,000 mL (0 mLs Intravenous Stopped 10/04/14 1713)  ketorolac (TORADOL) 30 MG/ML injection 30 mg (30 mg Intravenous Given 10/04/14 1614)  iohexol (OMNIPAQUE) 300 MG/ML solution 100 mL (100 mLs Intravenous Contrast Given 10/04/14 1628)  ondansetron (ZOFRAN) injection 4 mg (4 mg Intravenous Given 10/04/14 1659)  sodium chloride 0.9 % bolus 1,000 mL (1,000 mLs Intravenous New Bag/Given 10/04/14 1713)  metroNIDAZOLE (FLAGYL) tablet 500 mg (500 mg Oral Given 10/04/14 1711)     Eber Hong, MD 10/04/14 [phone]

## 2014-10-05 LAB — COMPREHENSIVE METABOLIC PANEL
ALBUMIN: 3.7 g/dL (ref 3.5–5.0)
ALT: 14 U/L (ref 14–54)
ANION GAP: 8 (ref 5–15)
AST: 16 U/L (ref 15–41)
Alkaline Phosphatase: 32 U/L — ABNORMAL LOW (ref 38–126)
BILIRUBIN TOTAL: 0.8 mg/dL (ref 0.3–1.2)
BUN: 5 mg/dL — AB (ref 6–20)
CHLORIDE: 108 mmol/L (ref 101–111)
CO2: 22 mmol/L (ref 22–32)
Calcium: 8.4 mg/dL — ABNORMAL LOW (ref 8.9–10.3)
Creatinine, Ser: 0.53 mg/dL (ref 0.44–1.00)
GFR calc Af Amer: >60 mL/min (ref >60)
Glucose, Bld: 134 mg/dL — ABNORMAL HIGH (ref 65–99)
POTASSIUM: 2.8 mmol/L — AB (ref 3.5–5.1)
Sodium: 138 mmol/L (ref 135–145)
TOTAL PROTEIN: 6.3 g/dL — AB (ref 6.5–8.1)

## 2014-10-05 LAB — CBC
HCT: 35.8 % — ABNORMAL LOW (ref 36.0–46.0)
Hemoglobin: 12.4 g/dL (ref 12.0–15.0)
MCH: 31.7 pg (ref 26.0–34.0)
MCHC: 34.6 g/dL (ref 30.0–36.0)
MCV: 91.6 fL (ref 78.0–100.0)
PLATELETS: 373 10*3/uL (ref 150–400)
RBC: 3.91 MIL/uL (ref 3.87–5.11)
RDW: 13.2 % (ref 11.5–15.5)
WBC: 13.7 10*3/uL — AB (ref 4.0–10.5)

## 2014-10-05 LAB — MAGNESIUM: Magnesium: 1.7 mg/dL (ref 1.7–2.4)

## 2014-10-05 MED ORDER — SODIUM CHLORIDE 0.9 % IV SOLN
INTRAVENOUS | Status: DC
  Administered 2014-10-05: 12:00:00 via INTRAVENOUS

## 2014-10-05 MED ORDER — POTASSIUM CHLORIDE CRYS ER 20 MEQ PO TBCR
40.0000 meq | EXTENDED_RELEASE_TABLET | ORAL | Status: AC
  Administered 2014-10-05 (×2): 40 meq via ORAL
  Filled 2014-10-05 (×2): qty 2

## 2014-10-05 MED ORDER — NICOTINE 21 MG/24HR TD PT24
21.0000 mg | MEDICATED_PATCH | Freq: Every day | TRANSDERMAL | Status: DC
  Administered 2014-10-05 – 2014-10-08 (×5): 21 mg via TRANSDERMAL
  Filled 2014-10-05 (×5): qty 1

## 2014-10-05 MED ORDER — POTASSIUM CHLORIDE 10 MEQ/100ML IV SOLN
10.0000 meq | INTRAVENOUS | Status: AC
  Administered 2014-10-05 (×4): 10 meq via INTRAVENOUS
  Filled 2014-10-05 (×2): qty 100

## 2014-10-05 NOTE — Progress Notes (Signed)
[facility] PROGRESS NOTE  Sheri Vargas ZOX:[id number] DOB: [birth year] DOA: 10/04/2014 PCP: Catalina Pizza, MD  Assessment/Plan: Sigmoid Colitis -As evidenced on CT scan. -Patient remains nauseous today. -Will slowly advance diet. -Continue IV cipro/flagyl. -Antiemetics as needed.  Tobacco Abuse -Nicotine patch. -Cessation counseling provided.  Anxiety Disorder -Continue home dose of xanax.  Code Status: Full Code Family Communication: Patient only  Disposition Plan: Home when ready; likely 24-48 hours   Consultants:  None   Antibiotics:  Cipro  Flagyl   Subjective: C/o significant nausea, pain improved; states overall feels better than yesterday.  Objective: Filed Vitals:   10/04/14 1717 10/04/14 2101 10/05/14 0543 10/05/14 1458  BP: 113/78 124/77 135/78 139/77  Pulse: 79 64 84 66  Temp: 97.9 F (36.6 C) 99.4 F (37.4 C) 99.9 F (37.7 C) 99.8 F (37.7 C)  TempSrc: Oral Oral Oral Oral  Resp: Height:   (1.6 m)    Weight:  64.864 kg (143 lb)    SpO2: 100% 100% 100% 99%   No intake or output data in the 24 hours ending 10/05/14 1504 Filed Weights   10/04/14 2101  Weight: 64.864 kg (143 lb)    Exam:   General:  AA Ox3  Cardiovascular: RRR, no M/R/G  Respiratory: CTA B  Abdomen: S/NT/ND/+BS  Extremities: no C/C/E   Neurologic:  Grossly intact and non-focal  Data Reviewed: Basic Metabolic Panel:  Recent Labs Lab 10/04/14 0730 10/05/14 0626 10/05/14 0638  NA 138 138  --   K 3.1* 2.8*  --   CL 107 108  --   CO2 20* 22  --   GLUCOSE 118* 134*  --   BUN 13 5*  --   CREATININE 0.66 0.53  --   CALCIUM 9.4 8.4*  --   MG  --   --  1.7   Liver Function Tests:  Recent Labs Lab 10/04/14 0730 10/05/14 0626  AST 23 16  ALT 17 14  ALKPHOS 42 32*  BILITOT 0.6 0.8  PROT 7.4 6.3*  ALBUMIN 4.5 3.7    Recent Labs Lab 10/04/14 0730  LIPASE 13*   No results for input(s): AMMONIA in the last 168  hours. CBC:  Recent Labs Lab 10/04/14 0730 10/05/14 0626  WBC 9.6 13.7*  NEUTROABS 6.8  --   HGB 14.7 12.4  HCT 41.1 35.8*  MCV 90.3 91.6  PLT 438* 373   Cardiac Enzymes: No results for input(s): CKTOTAL, CKMB, CKMBINDEX, TROPONINI in the last 168 hours. BNP (last 3 results) No results for input(s): BNP in the last 8760 hours.  ProBNP (last 3 results) No results for input(s): PROBNP in the last 8760 hours.  CBG: No results for input(s): GLUCAP in the last 168 hours.  Recent Results (from the past 240 hour(s))  Urine culture     Status: None (Preliminary result)   Collection Time: 10/04/14  7:30 AM  Result Value Ref Range Status   Specimen Description URINE, CLEAN CATCH  Final   Special Requests NONE  Final   Culture   Final    NO GROWTH < 12 HOURS Performed at [facility]    Report Status PENDING  Incomplete     Studies: Ct Abdomen Pelvis W Contrast  10/04/2014   CLINICAL DATA:  Constipation and abdomen pain for 1 week.  EXAM: CT ABDOMEN AND PELVIS WITH CONTRAST  TECHNIQUE: Multidetector CT imaging of the abdomen and pelvis was performed using the standard  protocol following bolus administration of intravenous contrast.  CONTRAST:  OMNIPAQUE IOHEXOL 300 MG/ML  SOLN  COMPARISON:  December 29, 2013  FINDINGS: The liver, spleen, pancreas, adrenal glands and kidneys are normal. The patient is status post prior cholecystectomy. Atherosclerosis of the abdominal aorta is identified without aneurysmal dilatation. There is no abdominal lymphadenopathy.  There is no small bowel obstruction. There is minimal hiatal hernia. There is mild diffuse bowel wall thickening of the sigmoid colon with minimal surrounding stranding. The appendix is not seen but no inflammation is noted around cecum. Mild to moderate bowel content is identified throughout colon.  Fluid-filled bladder is normal. The uterus is normal. There are small cysts in normal size bilateral ovaries. The lung bases  are clear. No acute abnormality is identified within the visualized bones.  IMPRESSION: Mild diffuse bowel wall thickening of the sigmoid colon with minimal surrounding stranding. This is nonspecific but can be due to colitis, either infectious or inflammatory.   Electronically Signed   By: Sherian Rein M.D.   On: 10/04/2014 16:48   Dg Abd Acute W/chest  10/04/2014   CLINICAL DATA:  Constipation for 1-2 weeks. Left lower quadrant abdominal pain, nausea, vomiting.  EXAM: DG ABDOMEN ACUTE W/ 1V CHEST  COMPARISON:  09/07/2012  FINDINGS: Prior cholecystectomy. Moderate stool throughout the colon. The bowel gas pattern is normal. There is no evidence of free intraperitoneal air. No suspicious radio-opaque calculi or other significant radiographic abnormality is seen. Heart size and mediastinal contours are within normal limits. Both lungs are clear.  IMPRESSION: No evidence of bowel obstruction or free air. Moderate stool burden.  No active cardiopulmonary disease.   Electronically Signed   By: Charlett Nose M.D.   On: 10/04/2014 11:52    Scheduled Meds: . ciprofloxacin  400 mg Intravenous Q12H  . citalopram  10 mg Oral Daily  . enoxaparin (LOVENOX) injection  40 mg Subcutaneous Q24H  . metronidazole  500 mg Intravenous Q8H  . nicotine  21 mg Transdermal Daily  . potassium chloride  40 mEq Oral Q4H   Continuous Infusions: . sodium chloride 100 mL/hr at 10/05/14 1212    Active Problems:   Anxiety   Chronic back pain   Colitis    Time spent: 25 minutes. Greater than 50% of this time was spent in direct contact with the patient coordinating care.    Chaya Jan  [facility] Pager [phone]  If 7PM-7AM, please contact night-coverage at [email or web address], password [email or web address] 10/05/2014, 3:04 PM  LOS: 1 day

## 2014-10-05 NOTE — Care Management Note (Signed)
Case Management Note  Patient Details  Name: Sheri Vargas MRN: [id number] Date of Birth: [birth year]  Subjective/Objective:                  Pt admitted from home with colitis, nausea and vomiting, Pt lives with her husband and will return home at discharge. Pt is independent with ADL's.  Action/Plan: No CM needs noted.  Expected Discharge Date:  10/09/14               Expected Discharge Plan:  Home/Self Care  In-House Referral:  NA  Discharge planning Services  CM Consult  Post Acute Care Choice:  NA Choice offered to:  NA  DME Arranged:    DME Agency:     HH Arranged:    HH Agency:     Status of Service:  Completed, signed off  Medicare Important Message Given:    Date Medicare IM Given:    Medicare IM give by:    Date Additional Medicare IM Given:    Additional Medicare Important Message give by:     If discussed at Long Length of Stay Meetings, dates discussed:    Additional Comments:  Cheryl Flash, RN 10/05/2014, 1:34 PM

## 2014-10-06 DIAGNOSIS — E876 Hypokalemia: Secondary | ICD-10-CM | POA: Diagnosis present

## 2014-10-06 DIAGNOSIS — Z720 Tobacco use: Secondary | ICD-10-CM | POA: Diagnosis present

## 2014-10-06 DIAGNOSIS — G8929 Other chronic pain: Secondary | ICD-10-CM | POA: Diagnosis present

## 2014-10-06 DIAGNOSIS — A09 Infectious gastroenteritis and colitis, unspecified: Principal | ICD-10-CM

## 2014-10-06 DIAGNOSIS — M545 Low back pain: Secondary | ICD-10-CM

## 2014-10-06 LAB — CBC
HCT: 34.8 % — ABNORMAL LOW (ref 36.0–46.0)
HEMOGLOBIN: 12.1 g/dL (ref 12.0–15.0)
MCH: 31.8 pg (ref 26.0–34.0)
MCHC: 34.8 g/dL (ref 30.0–36.0)
MCV: 91.3 fL (ref 78.0–100.0)
PLATELETS: 375 10*3/uL (ref 150–400)
RBC: 3.81 MIL/uL — AB (ref 3.87–5.11)
RDW: 13.1 % (ref 11.5–15.5)
WBC: 12.3 10*3/uL — ABNORMAL HIGH (ref 4.0–10.5)

## 2014-10-06 LAB — BASIC METABOLIC PANEL
Anion gap: 7 (ref 5–15)
BUN: 7 mg/dL (ref 6–20)
CHLORIDE: 108 mmol/L (ref 101–111)
CO2: 23 mmol/L (ref 22–32)
CREATININE: 0.57 mg/dL (ref 0.44–1.00)
Calcium: 8.3 mg/dL — ABNORMAL LOW (ref 8.9–10.3)
GFR calc non Af Amer: >60 mL/min (ref >60)
Glucose, Bld: 113 mg/dL — ABNORMAL HIGH (ref 65–99)
Potassium: 2.8 mmol/L — ABNORMAL LOW (ref 3.5–5.1)
Sodium: 138 mmol/L (ref 135–145)

## 2014-10-06 LAB — MAGNESIUM: Magnesium: 1.8 mg/dL (ref 1.7–2.4)

## 2014-10-06 LAB — URINE CULTURE

## 2014-10-06 MED ORDER — HYDROMORPHONE HCL 1 MG/ML IJ SOLN
0.5000 mg | INTRAMUSCULAR | Status: DC | PRN
  Administered 2014-10-06 – 2014-10-08 (×9): 0.5 mg via INTRAVENOUS
  Filled 2014-10-06 (×10): qty 1

## 2014-10-06 MED ORDER — SIMETHICONE 80 MG PO CHEW
80.0000 mg | CHEWABLE_TABLET | Freq: Three times a day (TID) | ORAL | Status: DC
  Administered 2014-10-06 – 2014-10-08 (×7): 80 mg via ORAL
  Filled 2014-10-06 (×7): qty 1

## 2014-10-06 MED ORDER — POTASSIUM CHLORIDE IN NACL 40-0.9 MEQ/L-% IV SOLN
INTRAVENOUS | Status: DC
  Administered 2014-10-06: 65 mL/h via INTRAVENOUS

## 2014-10-06 MED ORDER — POTASSIUM CHLORIDE 10 MEQ/100ML IV SOLN
10.0000 meq | INTRAVENOUS | Status: AC
  Administered 2014-10-06 (×3): 10 meq via INTRAVENOUS
  Filled 2014-10-06: qty 100

## 2014-10-06 NOTE — Progress Notes (Signed)
[facility] PROGRESS NOTE  Sheri Vargas ZOX:[id number] DOB: Mar 10, 1975 DOA: 10/04/2014 PCP: Catalina Pizza, MD    Code Status: Full code Family Communication: Family not available; discussed with patient Disposition Plan: Discharge with clinically appropriate   Consultants:  None  Procedures:  None  Antibiotics:  Cipro 9/14>>  Flagyl 9/14>>  HPI/Subjective: Patient complains of a gaseous feeling in her abdomen. She has not had a bowel movement in 2 days. She still has some crampy lower left lower quadrant abdominal pain. She has mild nausea but no vomiting.  Objective: Filed Vitals:   10/06/14 0659  BP: 148/93  Pulse: 60  Temp: 99.1 F (37.3 C)  Resp: 18    Intake/Output Summary (Last 24 hours) at 10/06/14 1036 Last data filed at 10/06/14 0958  Gross per 24 hour  Intake 1003.33 ml  Output      0 ml  Net 1003.33 ml   Filed Weights   10/04/14 2101  Weight: 64.864 kg (143 lb)    Exam:   General:  39 year old woman in no acute distress.  Cardiovascular: S1, S2, with borderline bradycardia.  Respiratory: Clear to auscultation bilaterally.  Abdomen: Positive bowel sounds, soft, minimal left lower quadrant tenderness without rebound or guarding.  Musculoskeletal/extremities: No pedal edema. No acute hot red joints.   Data Reviewed: Basic Metabolic Panel:  Recent Labs Lab 10/04/14 0730 10/05/14 0626 10/05/14 0638 10/06/14 0602  NA 138 138  --  138  K 3.1* 2.8*  --  2.8*  CL 107 108  --  108  CO2 20* 22  --  23  GLUCOSE 118* 134*  --  113*  BUN 13 5*  --  7  CREATININE 0.66 0.53  --  0.57  CALCIUM 9.4 8.4*  --  8.3*  MG  --   --  1.7 1.8   Liver Function Tests:  Recent Labs Lab 10/04/14 0730 10/05/14 0626  AST 23 16  ALT 17 14  ALKPHOS 42 32*  BILITOT 0.6 0.8  PROT 7.4 6.3*  ALBUMIN 4.5 3.7    Recent Labs Lab 10/04/14 0730  LIPASE 13*   No results for input(s): AMMONIA in the last 168 hours. CBC:  Recent Labs Lab  10/04/14 0730 10/05/14 0626 10/06/14 0602  WBC 9.6 13.7* 12.3*  NEUTROABS 6.8  --   --   HGB 14.7 12.4 12.1  HCT 41.1 35.8* 34.8*  MCV 90.3 91.6 91.3  PLT 438* 373 375   Cardiac Enzymes: No results for input(s): CKTOTAL, CKMB, CKMBINDEX, TROPONINI in the last 168 hours. BNP (last 3 results) No results for input(s): BNP in the last 8760 hours.  ProBNP (last 3 results) No results for input(s): PROBNP in the last 8760 hours.  CBG: No results for input(s): GLUCAP in the last 168 hours.  Recent Results (from the past 240 hour(s))  Urine culture     Status: None (Preliminary result)   Collection Time: 10/04/14  7:30 AM  Result Value Ref Range Status   Specimen Description URINE, CLEAN CATCH  Final   Special Requests NONE  Final   Culture   Final    CULTURE REINCUBATED FOR BETTER GROWTH Performed at [facility]    Report Status PENDING  Incomplete     Studies: Ct Abdomen Pelvis W Contrast  10/04/2014   CLINICAL DATA:  Constipation and abdomen pain for 1 week.  EXAM: CT ABDOMEN AND PELVIS WITH CONTRAST  TECHNIQUE: Multidetector CT imaging of the abdomen and pelvis was performed using  the standard protocol following bolus administration of intravenous contrast.  CONTRAST:  OMNIPAQUE IOHEXOL 300 MG/ML  SOLN  COMPARISON:  December 29, 2013  FINDINGS: The liver, spleen, pancreas, adrenal glands and kidneys are normal. The patient is status post prior cholecystectomy. Atherosclerosis of the abdominal aorta is identified without aneurysmal dilatation. There is no abdominal lymphadenopathy.  There is no small bowel obstruction. There is minimal hiatal hernia. There is mild diffuse bowel wall thickening of the sigmoid colon with minimal surrounding stranding. The appendix is not seen but no inflammation is noted around cecum. Mild to moderate bowel content is identified throughout colon.  Fluid-filled bladder is normal. The uterus is normal. There are small cysts in normal size  bilateral ovaries. The lung bases are clear. No acute abnormality is identified within the visualized bones.  IMPRESSION: Mild diffuse bowel wall thickening of the sigmoid colon with minimal surrounding stranding. This is nonspecific but can be due to colitis, either infectious or inflammatory.   Electronically Signed   By: Sherian Rein M.D.   On: 10/04/2014 16:48   Dg Abd Acute W/chest  10/04/2014   CLINICAL DATA:  Constipation for 1-2 weeks. Left lower quadrant abdominal pain, nausea, vomiting.  EXAM: DG ABDOMEN ACUTE W/ 1V CHEST  COMPARISON:  09/07/2012  FINDINGS: Prior cholecystectomy. Moderate stool throughout the colon. The bowel gas pattern is normal. There is no evidence of free intraperitoneal air. No suspicious radio-opaque calculi or other significant radiographic abnormality is seen. Heart size and mediastinal contours are within normal limits. Both lungs are clear.  IMPRESSION: No evidence of bowel obstruction or free air. Moderate stool burden.  No active cardiopulmonary disease.   Electronically Signed   By: Charlett Nose M.D.   On: 10/04/2014 11:52    Scheduled Meds: . ciprofloxacin  400 mg Intravenous Q12H  . citalopram  10 mg Oral Daily  . enoxaparin (LOVENOX) injection  40 mg Subcutaneous Q24H  . metronidazole  500 mg Intravenous Q8H  . nicotine  21 mg Transdermal Daily  . potassium chloride  10 mEq Intravenous Q1 Hr x 3  . simethicone  80 mg Oral TID   Continuous Infusions: . 0.9 % NaCl with KCl 40 mEq / L 65 mL/hr (10/06/14 4098)    Principal Problem:   Infective colitis Active Problems:   Hypokalemia   Anxiety   Chronic back pain   Tobacco abuse   Chronic low back pain    1. Sigmoid colitis, presumed to be infectious. On admission, the patient's abdominal CT revealed diffuse bowel wall thickening of the sigmoid colon with minimal surrounding stranding. On admission, she was afebrile; her white blood cell count was within normal limits; her LFTs and lipase were  within normal limits. She was started on Cipro and Flagyl IV. -Her W BC went up slightly, but is trending downward. -She continues to have some abdominal discomfort, but no evidence of diarrhea or bowel movements at this point. She complains of subjective gas. Simethicone was ordered. -She is afebrile. -We'll continue current treatment with IV antibiotics, IV fluids, and analgesic/antiemetics as needed. We'll try full liquids. Patient was encouraged to ambulate.  Hypokalemia. The patient's serum potassium was 3.1 on admission. With hydration, it has fallen to 2.8. -She is status post several IV runs of potassium chloride. We'll continue supplementation with IV runs and have added potassium chloride to the maintenance IV fluids. -Her magnesium level was assessed and was within normal limits.  Tobacco abuse. The patient was advised to stop  smoking. We'll continue nicotine patch.  Chronic anxiety/depression. Stable on Celexa.    Time spent: 30 minutes.    [facility]  [facility] Pager [phone]. If 7PM-7AM, please contact night-coverage at [email or web address], password [facility] 10/06/2014, 10:36 AM  LOS: 2 days

## 2014-10-06 NOTE — Care Management Note (Signed)
Case Management Note  Patient Details  Name: Sheri Vargas MRN: [id number] Date of Birth: Jan 31, 1975  Subjective/Objective:                    Action/Plan:   Expected Discharge Date:  10/09/14               Expected Discharge Plan:  Home/Self Care  In-House Referral:  NA  Discharge planning Services  CM Consult  Post Acute Care Choice:  NA Choice offered to:  NA  DME Arranged:    DME Agency:     HH Arranged:    HH Agency:     Status of Service:  Completed, signed off  Medicare Important Message Given:    Date Medicare IM Given:    Medicare IM give by:    Date Additional Medicare IM Given:    Additional Medicare Important Message give by:     If discussed at Long Length of Stay Meetings, dates discussed:    Additional Comments: Anticipate discharge over the weekend if tolerating po. No CM needs noted. Arlyss Queen Rebecca, RN 10/06/2014, 2:08 PM

## 2014-10-06 NOTE — Plan of Care (Signed)
Problem: Phase II Progression Outcomes Goal: Progress activity as tolerated unless otherwise ordered Outcome: Progressing New orders for the patient to ambulate in the hall.  I have discussed with the patient a plan to get up and she stated that should ambulate in the hall once her family comes and can walk with her.

## 2014-10-06 NOTE — Progress Notes (Signed)
Nutrition Brief Note  Patient identified on the Malnutrition Screening Tool (MST) Report  Wt Readings from Last 15 Encounters:  10/04/14 143 lb (64.864 kg)  10/04/14 143 lb (64.864 kg)  12/29/13 142 lb (64.411 kg)  03/06/13 144 lb 4.8 oz (65.454 kg)  09/07/12 155 lb (70.308 kg)  08/10/12 158 lb (71.668 kg)  06/25/12 155 lb 8 oz (70.534 kg)  06/17/12 159 lb (72.122 kg)  08/25/11 138 lb (62.596 kg)  03/03/11 137 lb (62.143 kg)   This is a 39 year old lady who had been constipated for the last week or so and yesterday took over-the-counter laxity. Last night she became nauseous and started vomiting and experienced generalized abdominal pain. There was no hematemesis. There was no rectal bleeding. There is no fever. Evaluation in the emergency room with a CT scan shows that she has colitis affecting the sigmoid colon. She is now being admitted for further management.  Body mass index is 25.34 kg/(m^2). Patient meets criteria for overweight based on current BMI.   Current diet order is full liquid, patient is consuming approximately 100% of meals at this time. Labs and medications reviewed.   No nutrition interventions warranted at this time. If nutrition issues arise, please consult RD.   Madisun Hargrove A. Mayford Knife, RD, LDN, CDE Pager: [phone] After hours Pager: [phone]

## 2014-10-07 DIAGNOSIS — M549 Dorsalgia, unspecified: Secondary | ICD-10-CM

## 2014-10-07 LAB — BASIC METABOLIC PANEL
ANION GAP: 5 (ref 5–15)
BUN: 5 mg/dL — ABNORMAL LOW (ref 6–20)
CO2: 24 mmol/L (ref 22–32)
Calcium: 8.4 mg/dL — ABNORMAL LOW (ref 8.9–10.3)
Chloride: 108 mmol/L (ref 101–111)
Creatinine, Ser: 0.58 mg/dL (ref 0.44–1.00)
GLUCOSE: 110 mg/dL — AB (ref 65–99)
POTASSIUM: 3.4 mmol/L — AB (ref 3.5–5.1)
Sodium: 137 mmol/L (ref 135–145)

## 2014-10-07 LAB — CBC
HCT: 37.2 % (ref 36.0–46.0)
Hemoglobin: 12.8 g/dL (ref 12.0–15.0)
MCH: 31.5 pg (ref 26.0–34.0)
MCHC: 34.4 g/dL (ref 30.0–36.0)
MCV: 91.6 fL (ref 78.0–100.0)
PLATELETS: 365 10*3/uL (ref 150–400)
RBC: 4.06 MIL/uL (ref 3.87–5.11)
RDW: 12.9 % (ref 11.5–15.5)
WBC: 11.5 10*3/uL — AB (ref 4.0–10.5)

## 2014-10-07 MED ORDER — SENNOSIDES-DOCUSATE SODIUM 8.6-50 MG PO TABS
2.0000 | ORAL_TABLET | Freq: Every day | ORAL | Status: DC
  Administered 2014-10-07: 2 via ORAL
  Filled 2014-10-07 (×2): qty 2

## 2014-10-07 MED ORDER — BISACODYL 10 MG RE SUPP
10.0000 mg | Freq: Once | RECTAL | Status: AC
  Administered 2014-10-07: 10 mg via RECTAL
  Filled 2014-10-07: qty 1

## 2014-10-07 MED ORDER — METRONIDAZOLE IVPB CUSTOM
250.0000 mg | Freq: Three times a day (TID) | INTRAVENOUS | Status: DC
  Administered 2014-10-07 – 2014-10-08 (×2): 250 mg via INTRAVENOUS
  Filled 2014-10-07 (×4): qty 50
  Filled 2014-10-07: qty 100
  Filled 2014-10-07 (×2): qty 50
  Filled 2014-10-07: qty 100

## 2014-10-07 MED ORDER — FAMOTIDINE IN NACL 20-0.9 MG/50ML-% IV SOLN
20.0000 mg | Freq: Two times a day (BID) | INTRAVENOUS | Status: DC
  Administered 2014-10-07 (×2): 20 mg via INTRAVENOUS
  Filled 2014-10-07 (×5): qty 50

## 2014-10-07 NOTE — Progress Notes (Signed)
[facility] PROGRESS NOTE  Sheri Vargas YNW:[id number] DOB: 1975/08/28 DOA: 10/04/2014 PCP: Catalina Pizza, MD    Code Status: Full code Family Communication: Family not available; discussed with patient Disposition Plan: Discharge with clinically appropriate   Consultants:  None  Procedures:  None  Antibiotics:  Cipro 9/14>>  Flagyl 9/14>>  HPI/Subjective: Patient complains of a sensation of constipation. She has not had a bowel movement in 3 days. Simethicone has helped with decreasing gaseous feeling. She still has some nausea but no vomiting. She is tolerating some of her diet.  Objective: Filed Vitals:   10/07/14 0602  BP: 136/88  Pulse: 60  Temp: 99 F (37.2 C)  Resp: 18    Intake/Output Summary (Last 24 hours) at 10/07/14 1335 Last data filed at 10/07/14 0900  Gross per 24 hour  Intake    600 ml  Output      0 ml  Net    600 ml   Filed Weights   10/04/14 2101  Weight: 64.864 kg (143 lb)    Exam:   General:  39 year old woman in no acute distress, but she looks uncomfortable.  Cardiovascular: S1, S2, with borderline bradycardia.  Respiratory: Clear to auscultation bilaterally.  Abdomen: Positive bowel sounds, soft, minimal left lower quadrant tenderness; mild epigastric tenderness. without rebound or guarding.  Musculoskeletal/extremities: No pedal edema. No acute hot red joints.   Data Reviewed: Basic Metabolic Panel:  Recent Labs Lab 10/04/14 0730 10/05/14 0626 10/05/14 0638 10/06/14 0602 10/07/14 0552  NA 138 138  --  138 137  K 3.1* 2.8*  --  2.8* 3.4*  CL 107 108  --  108 108  CO2 20* 22  --  23 24  GLUCOSE 118* 134*  --  113* 110*  BUN 13 5*  --  7 5*  CREATININE 0.66 0.53  --  0.57 0.58  CALCIUM 9.4 8.4*  --  8.3* 8.4*  MG  --   --  1.7 1.8  --    Liver Function Tests:  Recent Labs Lab 10/04/14 0730 10/05/14 0626  AST 23 16  ALT 17 14  ALKPHOS 42 32*  BILITOT 0.6 0.8  PROT 7.4 6.3*  ALBUMIN 4.5 3.7     Recent Labs Lab 10/04/14 0730  LIPASE 13*   No results for input(s): AMMONIA in the last 168 hours. CBC:  Recent Labs Lab 10/04/14 0730 10/05/14 0626 10/06/14 0602 10/07/14 0552  WBC 9.6 13.7* 12.3* 11.5*  NEUTROABS 6.8  --   --   --   HGB 14.7 12.4 12.1 12.8  HCT 41.1 35.8* 34.8* 37.2  MCV 90.3 91.6 91.3 91.6  PLT 438* 373 375 365   Cardiac Enzymes: No results for input(s): CKTOTAL, CKMB, CKMBINDEX, TROPONINI in the last 168 hours. BNP (last 3 results) No results for input(s): BNP in the last 8760 hours.  ProBNP (last 3 results) No results for input(s): PROBNP in the last 8760 hours.  CBG: No results for input(s): GLUCAP in the last 168 hours.  Recent Results (from the past 240 hour(s))  Urine culture     Status: None   Collection Time: 10/04/14  7:30 AM  Result Value Ref Range Status   Specimen Description URINE, CLEAN CATCH  Final   Special Requests NONE  Final   Culture   Final    MULTIPLE SPECIES PRESENT, SUGGEST RECOLLECTION Performed at [facility]    Report Status 10/06/2014 FINAL  Final     Studies: No results found.  Scheduled Meds: . ciprofloxacin  400 mg Intravenous Q12H  . citalopram  10 mg Oral Daily  . enoxaparin (LOVENOX) injection  40 mg Subcutaneous Q24H  . metronidazole  500 mg Intravenous Q8H  . nicotine  21 mg Transdermal Daily  . simethicone  80 mg Oral TID   Continuous Infusions: . 0.9 % NaCl with KCl 40 mEq / L 65 mL/hr (10/06/14 1610)    Principal Problem:   Infective colitis Active Problems:   Hypokalemia   Anxiety   Chronic back pain   Tobacco abuse   Chronic low back pain    1. Sigmoid colitis, presumed to be infectious. On admission, the patient's abdominal CT revealed diffuse bowel wall thickening of the sigmoid colon with minimal surrounding stranding. On admission, she was afebrile; her white blood cell count was within normal limits; her LFTs and lipase were within normal limits. She was started on  Cipro and Flagyl IV. -Her WBC is trending downward. -She continues to have some abdominal discomfort, but no evidence of diarrhea or bowel movements at this point. She complained of subjective gas. Simethicone was ordered and it has helped. -She complains of constipation. Will therefore order a Dulcolax suppository and enema as needed. We'll start daily at bedtime Senokot-S. Will add IV Pepcid empirically for mild epigastric discomfort. -We'll decrease dose of Flagyl as it may be causing some nausea. -Diet was advanced to regular diet which she tolerated only minimally; she does not want to go back to full liquids and will only a small amount of solids as tolerated.  Hypokalemia. The patient's serum potassium was 3.1 on admission. With hydration, it had fallen to 2.8. -She is status post several IV runs of potassium chloride. We'll continue supplementation with IV runs and have added potassium chloride to the maintenance IV fluids. -Her magnesium level was assessed and was within normal limits. -Serum potassium has improved.  Tobacco abuse. The patient was advised to stop smoking. We'll continue nicotine patch.  Chronic anxiety/depression. Stable on Celexa.    Time spent: 30 minutes.    [facility]  [facility] Pager [phone]. If 7PM-7AM, please contact night-coverage at [email or web address], password [facility] 10/07/2014, 1:35 PM  LOS: 3 days

## 2014-10-08 LAB — BASIC METABOLIC PANEL
ANION GAP: 5 (ref 5–15)
BUN: 11 mg/dL (ref 6–20)
CALCIUM: 8.6 mg/dL — AB (ref 8.9–10.3)
CO2: 26 mmol/L (ref 22–32)
CREATININE: 0.65 mg/dL (ref 0.44–1.00)
Chloride: 108 mmol/L (ref 101–111)
GFR calc Af Amer: >60 mL/min (ref >60)
GLUCOSE: 98 mg/dL (ref 65–99)
Potassium: 3.3 mmol/L — ABNORMAL LOW (ref 3.5–5.1)
Sodium: 139 mmol/L (ref 135–145)

## 2014-10-08 LAB — CBC
HEMATOCRIT: 36.3 % (ref 36.0–46.0)
Hemoglobin: 12.4 g/dL (ref 12.0–15.0)
MCH: 31.8 pg (ref 26.0–34.0)
MCHC: 34.2 g/dL (ref 30.0–36.0)
MCV: 93.1 fL (ref 78.0–100.0)
PLATELETS: 333 10*3/uL (ref 150–400)
RBC: 3.9 MIL/uL (ref 3.87–5.11)
RDW: 13.1 % (ref 11.5–15.5)
WBC: 10 10*3/uL (ref 4.0–10.5)

## 2014-10-08 MED ORDER — METRONIDAZOLE 250 MG PO TABS: 250.0000 mg | ORAL_TABLET | Freq: Three times a day (TID) | ORAL | Status: DC

## 2014-10-08 MED ORDER — POTASSIUM CHLORIDE CRYS ER 20 MEQ PO TBCR
20.0000 meq | EXTENDED_RELEASE_TABLET | Freq: Every day | ORAL | Status: AC
  Administered 2014-10-08: 20 meq via ORAL
  Filled 2014-10-08: qty 1

## 2014-10-08 MED ORDER — HYDROCODONE-ACETAMINOPHEN 5-325 MG PO TABS: ORAL_TABLET | ORAL | Status: DC

## 2014-10-08 MED ORDER — FAMOTIDINE 10 MG PO CHEW
10.0000 mg | CHEWABLE_TABLET | Freq: Two times a day (BID) | ORAL | Status: DC
Start: 2014-10-08 — End: 2020-12-24

## 2014-10-08 MED ORDER — CIPROFLOXACIN HCL 250 MG PO TABS
750.0000 mg | ORAL_TABLET | Freq: Once | ORAL | Status: AC
  Administered 2014-10-08: 750 mg via ORAL
  Filled 2014-10-08: qty 3

## 2014-10-08 MED ORDER — METRONIDAZOLE 500 MG PO TABS
500.0000 mg | ORAL_TABLET | Freq: Once | ORAL | Status: AC
  Administered 2014-10-08: 500 mg via ORAL
  Filled 2014-10-08: qty 1

## 2014-10-08 MED ORDER — CIPROFLOXACIN HCL 500 MG PO TABS
500.0000 mg | ORAL_TABLET | Freq: Two times a day (BID) | ORAL | Status: DC
Start: 2014-10-08 — End: 2020-12-24

## 2014-10-08 MED ORDER — ONDANSETRON 4 MG PO TBDP: ORAL_TABLET | ORAL | Status: DC

## 2014-10-08 MED ORDER — POLYETHYLENE GLYCOL 3350 17 G PO PACK: 17.0000 g | PACK | Freq: Every day | ORAL | Status: DC | PRN

## 2014-10-08 MED ORDER — POTASSIUM CHLORIDE CRYS ER 10 MEQ PO TBCR: 10.0000 meq | EXTENDED_RELEASE_TABLET | Freq: Every day | ORAL | Status: DC

## 2014-10-08 MED ORDER — SIMETHICONE 80 MG PO CHEW: 80.0000 mg | CHEWABLE_TABLET | Freq: Four times a day (QID) | ORAL | Status: DC | PRN

## 2014-10-08 NOTE — Discharge Summary (Signed)
Physician Discharge Summary  Sheri Vargas WJX:[id number] DOB: [birth year] DOA: 10/04/2014  PCP: Catalina Pizza, MD  Admit date: 10/04/2014 Discharge date: 10/08/2014  Time spent: Greater than 30 minutes  Recommendations for Outpatient Follow-up:  1. Recommend follow-up as the patient's serum potassium.  2.   Discharge Diagnoses:    1. Infectious sigmoid colitis. 2. Tobacco abuse. The patient was advised to stop smoking. 3. Hypokalemia. 4. Chronic anxiety. 5. Chronic low back pain. 6. Chronic constipation.  Discharge Condition: Improved.  Diet recommendation: as tolerated.  Filed Weights   10/04/14 2101  Weight: 64.864 kg (143 lb)    History of present illness:  The patient is a 39 year old woman with a history of chronic anxiety/depression and chronic low back pain who presented to the emergency department on 10/04/14 with a chief complaint of abdominal pain, nausea, and vomiting. In the ED, she was afebrile and hemodynamically stable. CT of her abdomen and pelvis revealed mild diffuse bowel wall thickening of the sigmoid colon with minimal surrounding stranding. She was admitted for further evaluation and management.  Hospital Course:  1. Sigmoid colitis, presumed to be infectious. On admission, the patient's abdominal CT revealed diffuse bowel wall thickening of the sigmoid colon with minimal surrounding stranding. On admission, she was afebrile; her white blood cell count was within normal limits; her LFTs and lipase were within normal limits. She was started on Cipro and Flagyl IV. Antiemetics and analgesics medications were ordered. Her white blood cell count did increase a couple days after admission. It subsequently normalized prior to discharge. -She continued to have some abdominal discomfort, but without evidence of diarrhea. She complained of subjective gas. Simethicone was ordered and it has helped. -She complained of constipation. Therefore a Dulcolax suppository was  ordered and she was started on daily at bedtime Senokot-S. When necessary enema was ordered. She did have a small bowel movement following the suppository. IV Pepcid was also added empirically for perceived indigestion. The dose of Flagyl was decreased after several days as it was thought to be contributing to some of her nausea. -The patient began to feel better. Her diet was advanced which she tolerated well. At the time of discharge, she had minimal GI complaints. She was afebrile and her white blood cell count was within normal limits. She was discharged on 5 more days of therapy with oral Cipro and Flagyl.  Hypokalemia. The patient's serum potassium was 3.1 on admission. With hydration, it had fallen to 2.8. -She was given several IV runs of potassium chloride. Potassium chloride was added to her may not IV fluids. -Her magnesium level was assessed and was within normal limits. -When she was able to take oral solids without nausea/vomiting, oral potassium chloride was ordered at the time of discharge. It will be continued for 2 more weeks following discharge. Recommend follow-up outpatient assessment.  Tobacco abuse. The patient was advised to stop smoking. We'll continue nicotine patch.  Chronic anxiety/depression. Stable on Celexa.  Procedures:  None   Consultations:  None  Discharge Exam: Filed Vitals:   10/08/14 0951  BP: 90/49  Pulse:   Temp:   Resp:    temp 97.9. Pulse 63. Respiratory rate 16. Oxygen saturation 97% on room air.   General: 39 year old woman in no acute distress. She looks much better today.  Cardiovascular: S1, S2, with no murmurs rubs or gallops.  Respiratory: Clear to auscultation bilaterally.  Abdomen: Positive bowel sounds, soft, nontender, nondistended.  Musculoskeletal/extremities: No pedal edema. No acute hot red joints.  Discharge Instructions   Discharge Instructions    Diet - low sodium heart healthy    Complete by:  As directed       Increase activity slowly    Complete by:  As directed           Current Discharge Medication List    START taking these medications   Details  ciprofloxacin (CIPRO) 500 MG tablet Take 1 tablet (500 mg total) by mouth 2 (two) times daily. Take for 4 more days for treatment. Qty: 8 tablet, Refills: 0    famotidine (PEPCID AC) 10 MG chewable tablet Chew 1 tablet (10 mg total) by mouth 2 (two) times daily.    metroNIDAZOLE (FLAGYL) 250 MG tablet Take 1 tablet (250 mg total) by mouth 3 (three) times daily. Take for 4 more days for treatment. Qty: 12 tablet, Refills: 0    polyethylene glycol (MIRALAX / GLYCOLAX) packet Take 17 g by mouth daily as needed.    potassium chloride SA (K-DUR,KLOR-CON) 10 MEQ tablet Take 1 tablet (10 mEq total) by mouth daily. Qty: 14 tablet, Refills: 0    simethicone (MYLICON) 80 MG chewable tablet Chew 1 tablet (80 mg total) by mouth every 6 (six) hours as needed for flatulence.      CONTINUE these medications which have CHANGED   Details  HYDROcodone-acetaminophen (NORCO/VICODIN) 5-325 MG per tablet 1 or 2 tabs PO q6 hours prn pain Qty: 25 tablet, Refills: 0    ondansetron (ZOFRAN ODT) 4 MG disintegrating tablet 4mg  ODT q4 hours prn nausea/vomit Qty: 20 tablet, Refills: 0      CONTINUE these medications which have NOT CHANGED   Details  ALPRAZolam (XANAX) 0.5 MG tablet Take 0.5 mg by mouth 2 (two) times daily as needed for anxiety.     bisacodyl (LAXATIVE) 10 MG suppository Place 10 mg rectally as needed for moderate constipation.    citalopram (CELEXA) 10 MG tablet Take 10 mg by mouth daily.      STOP taking these medications     ibuprofen (ADVIL,MOTRIN) 200 MG tablet      traMADol (ULTRAM) 50 MG tablet        Allergies  Allergen Reactions  . Latex    Follow-up Information    Schedule an appointment as soon as possible for a visit with Catalina Pizza, MD.   Specialty:  Internal Medicine   Why:  To be seen in 5-7 days for f/up of your  colitis   Contact information:    [address]  [place] [address] [phone]        The results of significant diagnostics from this hospitalization (including imaging, microbiology, ancillary and laboratory) are listed below for reference.    Significant Diagnostic Studies: Ct Abdomen Pelvis W Contrast  10/04/2014   CLINICAL DATA:  Constipation and abdomen pain for 1 week.  EXAM: CT ABDOMEN AND PELVIS WITH CONTRAST  TECHNIQUE: Multidetector CT imaging of the abdomen and pelvis was performed using the standard protocol following bolus administration of intravenous contrast.  CONTRAST:  OMNIPAQUE IOHEXOL 300 MG/ML  SOLN  COMPARISON:  December 29, 2013  FINDINGS: The liver, spleen, pancreas, adrenal glands and kidneys are normal. The patient is status post prior cholecystectomy. Atherosclerosis of the abdominal aorta is identified without aneurysmal dilatation. There is no abdominal lymphadenopathy.  There is no small bowel obstruction. There is minimal hiatal hernia. There is mild diffuse bowel wall thickening of the sigmoid colon with minimal surrounding stranding. The appendix is not  seen but no inflammation is noted around cecum. Mild to moderate bowel content is identified throughout colon.  Fluid-filled bladder is normal. The uterus is normal. There are small cysts in normal size bilateral ovaries. The lung bases are clear. No acute abnormality is identified within the visualized bones.  IMPRESSION: Mild diffuse bowel wall thickening of the sigmoid colon with minimal surrounding stranding. This is nonspecific but can be due to colitis, either infectious or inflammatory.   Electronically Signed   By: Sherian Rein M.D.   On: 10/04/2014 16:48   Dg Abd Acute W/chest  10/04/2014   CLINICAL DATA:  Constipation for 1-2 weeks. Left lower quadrant abdominal pain, nausea, vomiting.  EXAM: DG ABDOMEN ACUTE W/ 1V CHEST  COMPARISON:  09/07/2012  FINDINGS: Prior cholecystectomy. Moderate stool  throughout the colon. The bowel gas pattern is normal. There is no evidence of free intraperitoneal air. No suspicious radio-opaque calculi or other significant radiographic abnormality is seen. Heart size and mediastinal contours are within normal limits. Both lungs are clear.  IMPRESSION: No evidence of bowel obstruction or free air. Moderate stool burden.  No active cardiopulmonary disease.   Electronically Signed   By: Charlett Nose M.D.   On: 10/04/2014 11:52    Microbiology: Recent Results (from the past 240 hour(s))  Urine culture     Status: None   Collection Time: 10/04/14  7:30 AM  Result Value Ref Range Status   Specimen Description URINE, CLEAN CATCH  Final   Special Requests NONE  Final   Culture   Final    MULTIPLE SPECIES PRESENT, SUGGEST RECOLLECTION Performed at [facility]    Report Status 10/06/2014 FINAL  Final     Labs: Basic Metabolic Panel:  Recent Labs Lab 10/04/14 0730 10/05/14 0626 10/05/14 0638 10/06/14 0602 10/07/14 0552 10/08/14 0638  NA 138 138  --  138 137 139  K 3.1* 2.8*  --  2.8* 3.4* 3.3*  CL 107 108  --  108 108 108  CO2 20* 22  --  GLUCOSE 118* 134*  --  113* 110* 98  BUN 13 5*  --  7 5* 11  CREATININE 0.66 0.53  --  0.57 0.58 0.65  CALCIUM 9.4 8.4*  --  8.3* 8.4* 8.6*  MG  --   --  1.7 1.8  --   --    Liver Function Tests:  Recent Labs Lab 10/04/14 0730 10/05/14 0626  AST 23 16  ALT 17 14  ALKPHOS 42 32*  BILITOT 0.6 0.8  PROT 7.4 6.3*  ALBUMIN 4.5 3.7    Recent Labs Lab 10/04/14 0730  LIPASE 13*   No results for input(s): AMMONIA in the last 168 hours. CBC:  Recent Labs Lab 10/04/14 0730 10/05/14 0626 10/06/14 0602 10/07/14 0552 10/08/14 0638  WBC 9.6 13.7* 12.3* 11.5* 10.0  NEUTROABS 6.8  --   --   --   --   HGB 14.7 12.4 12.1 12.8 12.4  HCT 41.1 35.8* 34.8* 37.2 36.3  MCV 90.3 91.6 91.3 91.6 93.1  PLT 438* 373 375 365 333   Cardiac Enzymes: No results for input(s): CKTOTAL, CKMB,  CKMBINDEX, TROPONINI in the last 168 hours. BNP: BNP (last 3 results) No results for input(s): BNP in the last 8760 hours.  ProBNP (last 3 results) No results for input(s): PROBNP in the last 8760 hours.  CBG: No results for input(s): GLUCAP in the last 168 hours.     Signed:  FISHER,DENISE  [facility] 10/08/2014, 10:06 AM

## 2014-10-08 NOTE — Progress Notes (Signed)
There has been 6 unsuccessful attempt to start an IV. Dr.Le made aware. There are no new orders at this time.

## 2014-10-08 NOTE — Progress Notes (Signed)
Discharge instructions, medications, and follow up appointment reviewed and discussed with patient. All questions were answered and no further questions at this time. Pt in stable condition and in no acute distress at time of discharge. Pt escorted by nurse tech.

## 2014-10-31 ENCOUNTER — Encounter (INDEPENDENT_AMBULATORY_CARE_PROVIDER_SITE_OTHER): Payer: Self-pay | Admitting: *Deleted

## 2014-11-27 ENCOUNTER — Ambulatory Visit (INDEPENDENT_AMBULATORY_CARE_PROVIDER_SITE_OTHER): Payer: Self-pay | Admitting: Internal Medicine

## 2014-12-27 ENCOUNTER — Ambulatory Visit (INDEPENDENT_AMBULATORY_CARE_PROVIDER_SITE_OTHER): Payer: Self-pay | Admitting: Internal Medicine

## 2017-05-07 DIAGNOSIS — F17200 Nicotine dependence, unspecified, uncomplicated: Secondary | ICD-10-CM | POA: Diagnosis not present

## 2017-05-07 DIAGNOSIS — J3489 Other specified disorders of nose and nasal sinuses: Secondary | ICD-10-CM | POA: Insufficient documentation

## 2017-05-07 DIAGNOSIS — J310 Chronic rhinitis: Secondary | ICD-10-CM | POA: Diagnosis not present

## 2017-05-07 DIAGNOSIS — J329 Chronic sinusitis, unspecified: Secondary | ICD-10-CM | POA: Diagnosis not present

## 2017-05-24 ENCOUNTER — Other Ambulatory Visit: Payer: Self-pay

## 2017-05-24 ENCOUNTER — Encounter (HOSPITAL_COMMUNITY): Payer: Self-pay | Admitting: Emergency Medicine

## 2017-05-24 ENCOUNTER — Emergency Department (HOSPITAL_COMMUNITY)
Admission: EM | Admit: 2017-05-24 | Discharge: 2017-05-24 | Disposition: A | Discharge status: Home / Self Care | Payer: BLUE CROSS/BLUE SHIELD | Attending: Emergency Medicine | Admitting: Emergency Medicine

## 2017-05-24 DIAGNOSIS — K0381 Cracked tooth: Secondary | ICD-10-CM | POA: Insufficient documentation

## 2017-05-24 DIAGNOSIS — Z91040 Latex allergy status: Secondary | ICD-10-CM | POA: Diagnosis not present

## 2017-05-24 DIAGNOSIS — K0889 Other specified disorders of teeth and supporting structures: Secondary | ICD-10-CM

## 2017-05-24 DIAGNOSIS — Z79899 Other long term (current) drug therapy: Secondary | ICD-10-CM | POA: Diagnosis not present

## 2017-05-24 DIAGNOSIS — K029 Dental caries, unspecified: Secondary | ICD-10-CM | POA: Diagnosis not present

## 2017-05-24 DIAGNOSIS — E039 Hypothyroidism, unspecified: Secondary | ICD-10-CM | POA: Insufficient documentation

## 2017-05-24 DIAGNOSIS — F17210 Nicotine dependence, cigarettes, uncomplicated: Secondary | ICD-10-CM | POA: Diagnosis not present

## 2017-05-24 MED ORDER — HYDROCODONE-ACETAMINOPHEN 5-325 MG PO TABS: 1.0000 | ORAL_TABLET | Freq: Four times a day (QID) | ORAL | 0 refills | Status: DC | PRN

## 2017-05-24 MED ORDER — HYDROCODONE-ACETAMINOPHEN 5-325 MG PO TABS
2.0000 | ORAL_TABLET | Freq: Once | ORAL | Status: AC
  Administered 2017-05-24: 2 via ORAL
  Filled 2017-05-24: qty 2

## 2017-05-24 MED ORDER — CLINDAMYCIN HCL 150 MG PO CAPS: 450.0000 mg | ORAL_CAPSULE | Freq: Three times a day (TID) | ORAL | 0 refills | Status: DC

## 2017-05-24 MED ORDER — IBUPROFEN 800 MG PO TABS
800.0000 mg | ORAL_TABLET | Freq: Three times a day (TID) | ORAL | 0 refills | Status: DC
Start: 2017-05-24 — End: 2020-12-24

## 2017-05-24 NOTE — ED Notes (Signed)
Pt states she wants to go home at this time, PA notified.

## 2017-05-24 NOTE — ED Triage Notes (Signed)
Patient c/o left lower dental pain/abscess x2 days. Denies any fevers. Patient abscess being intermittent. Patient reports taking antibiotics (Amoxiciilin and Clavulanate 500/125mg )  in which she states has been taking the antibiotics x1 month. Per patient ENT has given her prescription for 6-8 week course.

## 2017-05-24 NOTE — ED Provider Notes (Signed)
[facility] EMERGENCY DEPARTMENT Provider Note   CSN: [id number] Arrival date & time: 05/24/17  1305     History   Chief Complaint Chief Complaint  Patient presents with  . Dental Pain    HPI Sheri Vargas is a 42 y.o. female with history of chronic intermittent dental pain who presents with a 2-day history of left lower dental pain and swelling.  Patient reports she was told she had to see an oral surgeon, however has not been able to afford to have the tooth extracted.  She reports having swelling such as this in the past.  She denies any fevers.  She has has been taking Augmentin at home since 05/07/2017 for sinus problem prescribed by her ENT, however this is not been helping her dental problem.   HPI  Past Medical History:  Diagnosis Date  . Anxiety   . Chronic back pain 06/17/2012  . Decreased libido 06/17/2012  . Degenerative disc disease   . Depression   . Ectopic pregnancy, tubal    5 ectopic pregnancies  . Hypothyroid 06/25/2012   TSH 5.561, will rx synthroid 25 mcg and recheck in 8 weeks  . Kidney stones   . Menorrhagia 06/17/2012  . Thyroid disease     Patient Active Problem List   Diagnosis Date Noted  . Tobacco abuse 10/06/2014  . Hypokalemia 10/06/2014  . Chronic low back pain 10/06/2014  . Infective colitis 10/04/2014  . Hypothyroid 06/25/2012  . Anxiety 06/17/2012  . Chronic back pain 06/17/2012  . Menorrhagia 06/17/2012  . Uterine enlargement 06/17/2012  . Decreased libido 06/17/2012    Past Surgical History:  Procedure Laterality Date  . BACK SURGERY     cyst removed  . CHOLECYSTECTOMY  2004  . ECTOPIC PREGNANCY SURGERY       OB History    Gravida  6   Para  1   Term      Preterm      AB  5   Living  1     SAB      TAB      Ectopic  5   Multiple      Live Births  1            Home Medications    Prior to Admission medications   Medication Sig Start Date End Date Taking? Authorizing Provider  ALPRAZolam  Prudy Feeler) 0.5 MG tablet Take 0.5 mg by mouth 2 (two) times daily as needed for anxiety.  08/04/14   [provider]  bisacodyl (LAXATIVE) 10 MG suppository Place 10 mg rectally as needed for moderate constipation.    [provider]  ciprofloxacin (CIPRO) 500 MG tablet Take 1 tablet (500 mg total) by mouth 2 (two) times daily. Take for 4 more days for treatment. 10/08/14   Elliot Cousin, MD  citalopram (CELEXA) 10 MG tablet Take 10 mg by mouth daily.    [provider]  clindamycin (CLEOCIN) 150 MG capsule Take 3 capsules (450 mg total) by mouth 3 (three) times daily. 05/24/17   [place] Basham, Waylan Boga, PA-C  famotidine (PEPCID AC) 10 MG chewable tablet Chew 1 tablet (10 mg total) by mouth 2 (two) times daily. 10/08/14   Elliot Cousin, MD  HYDROcodone-acetaminophen (NORCO/VICODIN) 5-325 MG tablet Take 1-2 tablets by mouth every 6 (six) hours as needed for severe pain. 05/24/17   Aiesha Leland, Waylan Boga, PA-C  ibuprofen (ADVIL,MOTRIN) 800 MG tablet Take 1 tablet (800 mg total) by mouth 3 (three) times  daily. 05/24/17   Emi Holes, PA-C  metroNIDAZOLE (FLAGYL) 250 MG tablet Take 1 tablet (250 mg total) by mouth 3 (three) times daily. Take for 4 more days for treatment. 10/08/14   Elliot Cousin, MD  ondansetron (ZOFRAN ODT) 4 MG disintegrating tablet  ODT q4 hours prn nausea/vomit 10/08/14   Elliot Cousin, MD  polyethylene glycol [facility] / GLYCOLAX) packet Take 17 g by mouth daily as needed. 10/08/14   Elliot Cousin, MD  potassium chloride SA (K-DUR,KLOR-CON) 10 MEQ tablet Take 1 tablet (10 mEq total) by mouth daily. 10/08/14   Elliot Cousin, MD  simethicone (MYLICON) 80 MG chewable tablet Chew 1 tablet (80 mg total) by mouth every 6 (six) hours as needed for flatulence. 10/08/14   Elliot Cousin, MD    Family History Family History  Problem Relation Age of Onset  . Hypertension Father   . Diabetes Paternal Grandmother   . Kidney Stones Brother   . Hypertension Son   . Hypertension  Paternal Uncle   . Hypertension Paternal Uncle     Social History Social History   Tobacco Use  . Smoking status: Current Every Day Smoker    Packs/day: 1.00    Years: 20.00    Pack years: 20.00    Types: Cigarettes    Last attempt to quit: 01/20/1994    Years since quitting: 23.3  . Smokeless tobacco: Never Used  Substance Use Topics  . Alcohol use: No  . Drug use: No     Allergies   Latex   Review of Systems Review of Systems  Constitutional: Negative for fever.  HENT: Positive for dental problem and facial swelling.      Physical Exam Updated Vital Signs BP 124/74 (BP Location: Right Arm)   Pulse 79   Temp 98 F (36.7 C) (Oral)   Resp 18   Ht  (1.6 m)   Wt 68.9 kg (152 lb)   LMP 05/03/2017   SpO2 98%   BMI 26.93 kg/m   Physical Exam  Constitutional: She appears well-developed and well-nourished. No distress.  HENT:  Head: Normocephalic and atraumatic.  Mouth/Throat: Oropharynx is clear and moist. No oropharyngeal exudate.    Eyes: Pupils are equal, round, and reactive to light. Conjunctivae are normal. Right eye exhibits no discharge. Left eye exhibits no discharge. No scleral icterus.  Neck: Normal range of motion. Neck supple. No thyromegaly present.  Cardiovascular: Normal rate, regular rhythm, normal heart sounds and intact distal pulses. Exam reveals no gallop and no friction rub.  No murmur heard. Pulmonary/Chest: Effort normal and breath sounds normal. No stridor. No respiratory distress. She has no wheezes. She has no rales.  Abdominal: Soft. Bowel sounds are normal. She exhibits no distension. There is no tenderness. There is no rebound and no guarding.  Musculoskeletal: She exhibits no edema.  Lymphadenopathy:    She has no cervical adenopathy.  Neurological: She is alert. Coordination normal.  Skin: Skin is warm and dry. No rash noted. She is not diaphoretic. No pallor.  Psychiatric: She has a normal mood and affect.  Nursing note and  vitals reviewed.    ED Treatments / Results  Labs (all labs ordered are listed, but only abnormal results are displayed) Labs Reviewed - No data to display  EKG None  Radiology No results found.  Procedures Procedures (including critical care time)  Medications Ordered in ED Medications  HYDROcodone-acetaminophen (NORCO/VICODIN) 5-325 MG per tablet 2 tablet (2 tablets Oral Given 05/24/17 1454)  Initial Impression / Assessment and Plan / ED Course  I have reviewed the triage vital signs and the nursing notes.  Pertinent labs & imaging results that were available during my care of the patient were reviewed by me and considered in my medical decision making (see chart for details).     Patient with acute on chronic dental pain.  Abscess may be developing, however patient is not interested in incision and drainage today and she would be better served to have this done by a dentist versus oral surgeon considering its chronicity.  Exam not concerning for Ludwig's angina or pharyngeal abscess.  Will treat with clindamycin and patient advised to contact ENT to discuss discontinuing Augmentin, she states it is not helping her sinusitis.  Patient given short prescription of Norco as well as ibuprofen for pain control.  I reviewed the [place] narcotic database and found no discrepancies.  Pt instructed to follow-up with dentist/oral surgeon as soon as possible.  Discussed return precautions.  Patient understands and agrees with plan.  Patient vitals stable here ED course and discharged in satisfactory condition.  Patient also evaluated by Dr. Adriana Simas who guided the patient's management and agrees with plan.  Final Clinical Impressions(s) / ED Diagnoses   Final diagnoses:  Pain, dental    ED Discharge Orders        Ordered    clindamycin (CLEOCIN) 150 MG capsule  3 times daily     05/24/17 1532    HYDROcodone-acetaminophen (NORCO/VICODIN) 5-325 MG tablet  Every 6 hours PRN     05/24/17 1532      ibuprofen (ADVIL,MOTRIN) 800 MG tablet  3 times daily     05/24/17 [address], PA-C 05/24/17 1617    Donnetta Hutching, MD 05/25/17 [phone]

## 2017-05-24 NOTE — Discharge Instructions (Signed)
Medications: Clindamycin, ibuprofen, Norco  Treatment: Take clindamycin as prescribed.  Make sure to finish all this medication.  Talk to your ear nose and throat doctor about stopping Augmentin, but call first before you do this.  Take ibuprofen every 8 hours as needed for pain.  For severe pain, you can take 1-2 Norco every 6 hours.  Follow-up: Please see your dentist or oral surgeon as soon as possible for further evaluation and treatment.  Please return to emergency department if you develop any new or worsening symptoms.

## 2017-12-08 DIAGNOSIS — F332 Major depressive disorder, recurrent severe without psychotic features: Secondary | ICD-10-CM | POA: Diagnosis not present

## 2017-12-08 DIAGNOSIS — E0590 Thyrotoxicosis, unspecified without thyrotoxic crisis or storm: Secondary | ICD-10-CM | POA: Diagnosis not present

## 2017-12-08 DIAGNOSIS — K047 Periapical abscess without sinus: Secondary | ICD-10-CM | POA: Diagnosis not present

## 2017-12-08 DIAGNOSIS — G894 Chronic pain syndrome: Secondary | ICD-10-CM | POA: Diagnosis not present

## 2019-02-10 ENCOUNTER — Encounter: Payer: BLUE CROSS/BLUE SHIELD | Admitting: Adult Health

## 2020-02-15 ENCOUNTER — Encounter (HOSPITAL_COMMUNITY): Payer: Self-pay | Admitting: Psychiatry

## 2020-02-15 ENCOUNTER — Telehealth (INDEPENDENT_AMBULATORY_CARE_PROVIDER_SITE_OTHER): Payer: 59 | Admitting: Psychiatry

## 2020-02-15 DIAGNOSIS — F331 Major depressive disorder, recurrent, moderate: Secondary | ICD-10-CM

## 2020-02-15 DIAGNOSIS — F411 Generalized anxiety disorder: Secondary | ICD-10-CM

## 2020-02-15 MED ORDER — BUSPIRONE HCL 7.5 MG PO TABS: 7.5000 mg | ORAL_TABLET | Freq: Every day | ORAL | 0 refills | Status: DC

## 2020-02-15 MED ORDER — SERTRALINE HCL 50 MG PO TABS: 50.0000 mg | ORAL_TABLET | Freq: Every day | ORAL | 0 refills | Status: DC
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
## 2020-03-07 ENCOUNTER — Telehealth (HOSPITAL_COMMUNITY): Payer: 59 | Admitting: Psychiatry

## 2020-03-15 ENCOUNTER — Other Ambulatory Visit (HOSPITAL_COMMUNITY): Payer: Self-pay | Admitting: Psychiatry

## 2020-03-27 ENCOUNTER — Other Ambulatory Visit (HOSPITAL_COMMUNITY): Payer: Self-pay | Admitting: Psychiatry

## 2020-05-01 ENCOUNTER — Other Ambulatory Visit (HOSPITAL_COMMUNITY): Payer: Self-pay | Admitting: Psychiatry

## 2020-05-08 ENCOUNTER — Other Ambulatory Visit: Payer: Self-pay | Admitting: Obstetrics & Gynecology

## 2020-12-24 ENCOUNTER — Encounter (INDEPENDENT_AMBULATORY_CARE_PROVIDER_SITE_OTHER): Payer: Self-pay | Admitting: *Deleted

## 2020-12-24 ENCOUNTER — Ambulatory Visit: Payer: 59 | Admitting: Adult Health

## 2020-12-24 ENCOUNTER — Other Ambulatory Visit (HOSPITAL_COMMUNITY)
Admission: RE | Admit: 2020-12-24 | Discharge: 2020-12-24 | Disposition: A | Discharge status: Home / Self Care | Payer: 59 | Source: Ambulatory Visit | Attending: Adult Health | Admitting: Adult Health

## 2020-12-24 ENCOUNTER — Other Ambulatory Visit: Payer: Self-pay

## 2020-12-24 ENCOUNTER — Encounter: Payer: Self-pay | Admitting: Adult Health

## 2020-12-24 VITALS — BP 121/84 | HR 92 | Ht 63.0 in | Wt 193.0 lb

## 2020-12-24 DIAGNOSIS — E039 Hypothyroidism, unspecified: Secondary | ICD-10-CM

## 2020-12-24 DIAGNOSIS — F419 Anxiety disorder, unspecified: Secondary | ICD-10-CM | POA: Diagnosis not present

## 2020-12-24 DIAGNOSIS — Z01419 Encounter for gynecological examination (general) (routine) without abnormal findings: Secondary | ICD-10-CM | POA: Insufficient documentation

## 2020-12-24 DIAGNOSIS — F32A Depression, unspecified: Secondary | ICD-10-CM

## 2020-12-24 DIAGNOSIS — R350 Frequency of micturition: Secondary | ICD-10-CM

## 2020-12-24 DIAGNOSIS — Z1212 Encounter for screening for malignant neoplasm of rectum: Secondary | ICD-10-CM

## 2020-12-24 DIAGNOSIS — R61 Generalized hyperhidrosis: Secondary | ICD-10-CM

## 2020-12-24 DIAGNOSIS — Z720 Tobacco use: Secondary | ICD-10-CM

## 2020-12-24 DIAGNOSIS — Z1211 Encounter for screening for malignant neoplasm of colon: Secondary | ICD-10-CM | POA: Diagnosis not present

## 2020-12-24 DIAGNOSIS — R6882 Decreased libido: Secondary | ICD-10-CM

## 2020-12-24 DIAGNOSIS — N951 Menopausal and female climacteric states: Secondary | ICD-10-CM

## 2020-12-24 DIAGNOSIS — Z1231 Encounter for screening mammogram for malignant neoplasm of breast: Secondary | ICD-10-CM | POA: Insufficient documentation

## 2020-12-24 DIAGNOSIS — R635 Abnormal weight gain: Secondary | ICD-10-CM

## 2020-12-24 LAB — HEMOCCULT GUIAC POC 1CARD (OFFICE): Fecal Occult Blood, POC: NEGATIVE

## 2020-12-24 LAB — POCT URINALYSIS DIPSTICK OB
Glucose, UA: NEGATIVE
Leukocytes, UA: NEGATIVE
Nitrite, UA: NEGATIVE

## 2020-12-24 NOTE — Progress Notes (Signed)
Patient ID: Sheri Vargas, female   DOB: [birth year], 45 y.o.   MRN: [id number] History of Present Illness: Sheri Vargas is a 45 year old white female,married, [id number] in for a well woman gyn exam and pap. She is new pt and last pap about 2014.  She is complaining of weight gain, decreased libido and night sweats. She is off Celexa for 5-6 months, was seeing psychiatrist  on line.  She is recently back on thyroid meds too. Periods regular.  She had labs 11/28/20 with Dr Margo Aye, will scan labs.  PCP is Dr  Margo Aye.   Current Medications, Allergies, Past Medical History, Past Surgical History, Family History and Social History were reviewed in [employer] record.     Review of Systems:  Patient denies any headaches, hearing loss, fatigue, blurred vision, shortness of breath, chest pain, abdominal pain, problems with bowel movements, urination,(has UF). or intercourse. No joint pain or mood swings.  See HPI for positives.   Physical Exam:BP 121/84 (BP Location: Right Arm, Patient Position: Sitting, Cuff Size: Normal)   Pulse 92   Ht 5\' 3"  (1.6 m)   Wt 193 lb (87.5 kg)   LMP 12/10/2020   BMI 34.19 kg/m  urine dipstick +blood,protein  and  ketones. General:  Well developed, well nourished, no acute distress Skin:  Warm and dry Neck:  Midline trachea, normal thyroid, good ROM, no lymphadenopathy Lungs; Clear to auscultation bilaterally Breast:  No dominant palpable mass, retraction, or nipple discharge Cardiovascular: Regular rate and rhythm Abdomen:  Soft, non tender, no hepatosplenomegaly Pelvic:  External genitalia is normal in appearance, no lesions.  The vagina is normal in appearance. Urethra has no lesions or masses. The cervix is smooth, pap with HR HPV genotyping performed.   Uterus is felt to be normal size, shape, and contour.  No adnexal masses or tenderness noted.Bladder is non tender, no masses felt. Rectal: Good sphincter tone, no polyps, or hemorrhoids felt.   Hemoccult negative. Extremities/musculoskeletal:  No swelling or varicosities noted, no clubbing or cyanosis Psych:  No mood changes, alert and cooperative,seems happy AA is 0 Fall risk is low Depression screen PHQ 2/9 12/24/2020  Decreased Interest 3  Down, Depressed, Hopeless 3  PHQ - 2 Score 6  Altered sleeping 3  Tired, decreased energy 3  Change in appetite 3  Feeling bad or failure about yourself  3  Trouble concentrating 3  Moving slowly or fidgety/restless 3  Suicidal thoughts 0  PHQ-9 Score 24    GAD 7 : Generalized Anxiety Score 12/24/2020  Nervous, Anxious, on Edge 3  Control/stop worrying 3  Worry too much - different things 3  Trouble relaxing 3  Restless 3  Easily annoyed or irritable 3  Afraid - awful might happen 3  Total GAD 7 Score 21      Upstream - 12/24/20 1024       Pregnancy Intention Screening   Does the patient want to become pregnant in the next year? No    Does the patient's partner want to become pregnant in the next year? No    Would the patient like to discuss contraceptive options today? No      Contraception Wrap Up   Current Method Abstinence    End Method Abstinence    Contraception Counseling Provided No            Examination chaperoned by 14/05/22 LPN  Impression and Plan:  1. Encounter for gynecological examination with Papanicolaou smear of cervix  Pap sent Physical in 1 year Pap in 3 year if normal  - Cytology - PAP( [place])  2. Urinary frequency UA C&S sent  - POC Urinalysis Dipstick OB - Urine Culture - Urinalysis  3. Hypothyroidism, unspecified type  4. Anxiety and depression Call psychiatrist back  5. Weight gain  6. Decreased libido  7. Night sweats Will check FSH - Follicle stimulating hormone  8. Tobacco abuse Try to decrease smoking   9. Peri-menopause Check FSH - Follicle stimulating hormone Review handout on HRT Follow up with me in 2 weeks to talk about HRT more  10.  Encounter for screening fecal occult blood testing - POCT occult blood stool  11. Screening for colorectal cancer Referred to Dr Karilyn Cota for colonoscopy  - Ambulatory referral to Gastroenterology  12. Screening mammogram for breast cancer Mammogram scheduled for her at [facility] 01/03/21 at 3:30 pm  - MM 3D SCREEN BREAST BILATERAL; Future

## 2020-12-25 LAB — URINALYSIS
Bilirubin, UA: NEGATIVE
Glucose, UA: NEGATIVE
Leukocytes,UA: NEGATIVE
Nitrite, UA: NEGATIVE
Specific Gravity, UA: >=1.03 — AB (ref 1.005–1.030)
Urobilinogen, Ur: 1 mg/dL (ref 0.2–1.0)
pH, UA: 5.5 (ref 5.0–7.5)

## 2020-12-25 LAB — FOLLICLE STIMULATING HORMONE: FSH: 7 m[IU]/mL

## 2020-12-26 LAB — URINE CULTURE

## 2020-12-27 LAB — CYTOLOGY - PAP
Comment: NEGATIVE
Diagnosis: NEGATIVE
High risk HPV: NEGATIVE

## 2021-01-03 ENCOUNTER — Ambulatory Visit (HOSPITAL_COMMUNITY): Payer: 59

## 2021-01-07 ENCOUNTER — Encounter: Payer: Self-pay | Admitting: Adult Health

## 2021-01-07 ENCOUNTER — Other Ambulatory Visit: Payer: Self-pay

## 2021-01-07 ENCOUNTER — Ambulatory Visit (INDEPENDENT_AMBULATORY_CARE_PROVIDER_SITE_OTHER): Payer: 59 | Admitting: Adult Health

## 2021-01-07 VITALS — BP 128/89 | HR 90 | Ht 63.0 in | Wt 186.5 lb

## 2021-01-07 DIAGNOSIS — R6882 Decreased libido: Secondary | ICD-10-CM

## 2021-01-07 DIAGNOSIS — F32A Depression, unspecified: Secondary | ICD-10-CM

## 2021-01-07 DIAGNOSIS — R61 Generalized hyperhidrosis: Secondary | ICD-10-CM | POA: Diagnosis not present

## 2021-01-07 DIAGNOSIS — F419 Anxiety disorder, unspecified: Secondary | ICD-10-CM

## 2021-01-07 DIAGNOSIS — N951 Menopausal and female climacteric states: Secondary | ICD-10-CM | POA: Diagnosis not present

## 2021-01-07 DIAGNOSIS — F17200 Nicotine dependence, unspecified, uncomplicated: Secondary | ICD-10-CM | POA: Insufficient documentation

## 2021-01-07 MED ORDER — CLIMARA PRO 0.045-0.015 MG/DAY TD PTWK: 1.0000 | MEDICATED_PATCH | TRANSDERMAL | 12 refills | Status: DC

## 2021-01-07 NOTE — Progress Notes (Signed)
°  Subjective:     Patient ID: Sheri Vargas, female   DOB: 25-Jul-1975, 45 y.o.   MRN: [id number]  HPI Sheri Vargas is a 45 year old white female,married, [id number], back in follow up, and wants to try HRT. Lab Results  Component Value Date   DIAGPAP  12/24/2020    - Negative for intraepithelial lesion or malignancy (NILM)   HPVHIGH Negative 12/24/2020    PCP is Dr Sheri Vargas. Review of Systems +night sweats Decreased libido +weight gain +anxiety and depression Reviewed past medical,surgical, social and family history. Reviewed medications and allergies.     Objective:   Physical Exam BP 128/89 (BP Location: Left Arm, Patient Position: Sitting, Cuff Size: Normal)    Pulse 90    Ht 5\' 3"  (1.6 m)    Wt 186 lb 8 oz (84.6 kg)    LMP 01/05/2021    BMI 33.04 kg/m   Skin warm and dry.  Lungs: clear to ausculation bilaterally. Cardiovascular: regular rate and rhythm..   Fall risk is low  Upstream - 01/07/21 0940       Pregnancy Intention Screening   Does the patient want to become pregnant in the next year? No    Does the patient's partner want to become pregnant in the next year? No    Would the patient like to discuss contraceptive options today? No      Contraception Wrap Up   Current Method Abstinence    End Method Abstinence    Contraception Counseling Provided No               Depression screen [facility] 2/9 01/07/2021 12/24/2020  Decreased Interest 2 3  Down, Depressed, Hopeless 3 3  PHQ - 2 Score 5 6  Altered sleeping 2 3  Tired, decreased energy 3 3  Change in appetite 3 3  Feeling bad or failure about yourself  2 3  Trouble concentrating 2 3  Moving slowly or fidgety/restless 0 3  Suicidal thoughts 0 0  PHQ-9 Score 17 24  . GAD 7 : Generalized Anxiety Score 01/07/2021 12/24/2020  Nervous, Anxious, on Edge 3 3  Control/stop worrying 3 3  Worry too much - different things 3 3  Trouble relaxing 2 3  Restless 0 3  Easily annoyed or irritable 2 3  Afraid - awful might happen  3 3  Total GAD 7 Score 16 21     Assessment:    1. Night sweats Discussed HRT,aware of benefits and risks  and she wants to try Meds ordered this encounter  Medications   estradiol-levonorgestrel (CLIMARA PRO) 0.045-0.015 MG/DAY    Sig: Place 1 patch onto the skin once a week.    Dispense:  4 patch    Refill:  12    Order Specific Question:   Supervising Provider    Answer:   04-03-1978 H [[id number]]   Review  handout on HRT   2. Decreased libido  3. Peri-menopause FSH 7   4. Anxiety and depression Call psychiatrist   5. Smoker Will try to decrease,encouraged     Plan:     Has mammogram 01/09/21 Follow up in 8 weeks

## 2021-01-09 ENCOUNTER — Other Ambulatory Visit: Payer: Self-pay | Admitting: Adult Health

## 2021-01-09 ENCOUNTER — Ambulatory Visit (HOSPITAL_COMMUNITY): Payer: 59

## 2021-01-09 MED ORDER — NORETHINDRONE ACETATE 5 MG PO TABS: 5.0000 mg | ORAL_TABLET | Freq: Every day | ORAL | 2 refills | Status: DC

## 2021-01-09 NOTE — Progress Notes (Signed)
Will discontinue climara pro and try aygestin

## 2021-01-10 ENCOUNTER — Emergency Department (HOSPITAL_COMMUNITY): Payer: 59

## 2021-01-10 ENCOUNTER — Other Ambulatory Visit: Payer: Self-pay

## 2021-01-10 ENCOUNTER — Encounter (HOSPITAL_COMMUNITY): Payer: Self-pay | Admitting: *Deleted

## 2021-01-10 ENCOUNTER — Inpatient Hospital Stay (HOSPITAL_COMMUNITY)
Admission: EM | Admit: 2021-01-10 | Discharge: 2021-01-12 | DRG: 280 | Disposition: A | Discharge status: Home / Self Care | Payer: 59 | Attending: Cardiovascular Disease | Admitting: Cardiovascular Disease

## 2021-01-10 DIAGNOSIS — Z79890 Hormone replacement therapy (postmenopausal): Secondary | ICD-10-CM | POA: Diagnosis not present

## 2021-01-10 DIAGNOSIS — I700 Atherosclerosis of aorta: Secondary | ICD-10-CM | POA: Diagnosis present

## 2021-01-10 DIAGNOSIS — I214 Non-ST elevation (NSTEMI) myocardial infarction: Principal | ICD-10-CM | POA: Diagnosis present

## 2021-01-10 DIAGNOSIS — I5031 Acute diastolic (congestive) heart failure: Secondary | ICD-10-CM | POA: Diagnosis present

## 2021-01-10 DIAGNOSIS — F419 Anxiety disorder, unspecified: Secondary | ICD-10-CM | POA: Diagnosis present

## 2021-01-10 DIAGNOSIS — F17210 Nicotine dependence, cigarettes, uncomplicated: Secondary | ICD-10-CM | POA: Diagnosis present

## 2021-01-10 DIAGNOSIS — Z7982 Long term (current) use of aspirin: Secondary | ICD-10-CM | POA: Diagnosis not present

## 2021-01-10 DIAGNOSIS — G8929 Other chronic pain: Secondary | ICD-10-CM | POA: Diagnosis present

## 2021-01-10 DIAGNOSIS — Z79899 Other long term (current) drug therapy: Secondary | ICD-10-CM | POA: Diagnosis not present

## 2021-01-10 DIAGNOSIS — E785 Hyperlipidemia, unspecified: Secondary | ICD-10-CM | POA: Diagnosis present

## 2021-01-10 DIAGNOSIS — R079 Chest pain, unspecified: Secondary | ICD-10-CM | POA: Diagnosis not present

## 2021-01-10 DIAGNOSIS — Z9049 Acquired absence of other specified parts of digestive tract: Secondary | ICD-10-CM

## 2021-01-10 DIAGNOSIS — Z91040 Latex allergy status: Secondary | ICD-10-CM

## 2021-01-10 DIAGNOSIS — Z833 Family history of diabetes mellitus: Secondary | ICD-10-CM

## 2021-01-10 DIAGNOSIS — Z8249 Family history of ischemic heart disease and other diseases of the circulatory system: Secondary | ICD-10-CM | POA: Diagnosis not present

## 2021-01-10 DIAGNOSIS — I2510 Atherosclerotic heart disease of native coronary artery without angina pectoris: Secondary | ICD-10-CM | POA: Diagnosis not present

## 2021-01-10 DIAGNOSIS — E669 Obesity, unspecified: Secondary | ICD-10-CM | POA: Diagnosis present

## 2021-01-10 DIAGNOSIS — Z716 Tobacco abuse counseling: Secondary | ICD-10-CM | POA: Diagnosis not present

## 2021-01-10 DIAGNOSIS — Z20822 Contact with and (suspected) exposure to covid-19: Secondary | ICD-10-CM | POA: Diagnosis present

## 2021-01-10 DIAGNOSIS — Z6831 Body mass index (BMI) 31.0-31.9, adult: Secondary | ICD-10-CM

## 2021-01-10 DIAGNOSIS — E039 Hypothyroidism, unspecified: Secondary | ICD-10-CM | POA: Diagnosis present

## 2021-01-10 DIAGNOSIS — Z720 Tobacco use: Secondary | ICD-10-CM | POA: Diagnosis not present

## 2021-01-10 DIAGNOSIS — F32A Depression, unspecified: Secondary | ICD-10-CM | POA: Diagnosis present

## 2021-01-10 DIAGNOSIS — E876 Hypokalemia: Secondary | ICD-10-CM | POA: Diagnosis present

## 2021-01-10 DIAGNOSIS — R7989 Other specified abnormal findings of blood chemistry: Secondary | ICD-10-CM | POA: Diagnosis not present

## 2021-01-10 LAB — POC URINE PREG, ED: Preg Test, Ur: NEGATIVE

## 2021-01-10 LAB — CBC
HCT: 40.4 % (ref 36.0–46.0)
Hemoglobin: 13.3 g/dL (ref 12.0–15.0)
MCH: 30.6 pg (ref 26.0–34.0)
MCHC: 32.9 g/dL (ref 30.0–36.0)
MCV: 93.1 fL (ref 80.0–100.0)
Platelets: 482 10*3/uL — ABNORMAL HIGH (ref 150–400)
RBC: 4.34 MIL/uL (ref 3.87–5.11)
RDW: 13.9 % (ref 11.5–15.5)
WBC: 11.7 10*3/uL — ABNORMAL HIGH (ref 4.0–10.5)
nRBC: 0 % (ref 0.0–0.2)

## 2021-01-10 LAB — BASIC METABOLIC PANEL
Anion gap: 14 (ref 5–15)
BUN: 11 mg/dL (ref 6–20)
CO2: 20 mmol/L — ABNORMAL LOW (ref 22–32)
Calcium: 8.8 mg/dL — ABNORMAL LOW (ref 8.9–10.3)
Chloride: 103 mmol/L (ref 98–111)
Creatinine, Ser: 0.56 mg/dL (ref 0.44–1.00)
GFR, Estimated: >60 mL/min (ref >60)
Glucose, Bld: 117 mg/dL — ABNORMAL HIGH (ref 70–99)
Potassium: 2.9 mmol/L — ABNORMAL LOW (ref 3.5–5.1)
Sodium: 137 mmol/L (ref 135–145)

## 2021-01-10 LAB — RESP PANEL BY RT-PCR (FLU A&B, COVID) ARPGX2
Influenza A by PCR: NEGATIVE
Influenza B by PCR: NEGATIVE
SARS Coronavirus 2 by RT PCR: NEGATIVE

## 2021-01-10 LAB — TROPONIN I (HIGH SENSITIVITY)
Troponin I (High Sensitivity): 909 ng/L (ref <18)
Troponin I (High Sensitivity): 1196 ng/L (ref <18)

## 2021-01-10 MED ORDER — ALPRAZOLAM 0.5 MG PO TABS
0.5000 mg | ORAL_TABLET | Freq: Once | ORAL | Status: AC
  Administered 2021-01-10: 21:00:00 0.5 mg via ORAL
  Filled 2021-01-10: qty 1

## 2021-01-10 MED ORDER — SODIUM CHLORIDE 0.9 % IV BOLUS
500.0000 mL | Freq: Once | INTRAVENOUS | Status: AC
  Administered 2021-01-10: 20:00:00 500 mL via INTRAVENOUS

## 2021-01-10 MED ORDER — IOHEXOL 350 MG/ML SOLN
75.0000 mL | Freq: Once | INTRAVENOUS | Status: AC | PRN
  Administered 2021-01-10: 20:00:00 75 mL via INTRAVENOUS

## 2021-01-10 MED ORDER — HEPARIN BOLUS VIA INFUSION
4000.0000 [IU] | Freq: Once | INTRAVENOUS | Status: AC
  Administered 2021-01-10: 21:00:00 4000 [IU] via INTRAVENOUS

## 2021-01-10 MED ORDER — HEPARIN (PORCINE) 25000 UT/250ML-% IV SOLN
1250.0000 [IU]/h | INTRAVENOUS | Status: DC
  Administered 2021-01-10: 21:00:00 950 [IU]/h via INTRAVENOUS
  Filled 2021-01-10: qty 250

## 2021-01-10 MED ORDER — POTASSIUM CHLORIDE 20 MEQ PO PACK
40.0000 meq | PACK | Freq: Once | ORAL | Status: AC
  Administered 2021-01-10: 21:00:00 40 meq via ORAL
  Filled 2021-01-10: qty 2

## 2021-01-10 NOTE — ED Triage Notes (Signed)
States she had an episode of chest pain which started last night. Recently started on thyroid medication and recently started taking Zoloft again, states Zoloft has helped her anxiety and she is having trouble sleeping

## 2021-01-10 NOTE — ED Provider Notes (Signed)
[facility] EMERGENCY DEPARTMENT Provider Note   CSN: [id number] Arrival date & time: 01/10/21  1714     History Chief Complaint  Patient presents with   Chest Pain    Sheri Vargas is a 45 y.o. female with history of hypothyroidism and depression who presents the emergency department with left-sided chest pressure constant since last night.  Patient states that the chest pressure came out of nowhere and radiates into the left arm and into the left neck.  Patient states she recently restarted levothyroxine and Zoloft and thought that her symptoms were attributed to his medications.  Patient reports associated diaphoresis but denies any syncope, shortness of breath, nausea, vomiting, diarrhea, fever, chills, abdominal pain.  Nothing seems to make her chest pressure better or worse.  She has not taken any medications for her pain.   Chest Pain     Past Medical History:  Diagnosis Date   Anxiety    Chronic back pain 06/17/2012   Decreased libido 06/17/2012   Degenerative disc disease    Depression    Ectopic pregnancy, tubal    5 ectopic pregnancies   Hypothyroid 06/25/2012   TSH 5.561, will rx synthroid 25 mcg and recheck in 8 weeks   Kidney stones    Menorrhagia 06/17/2012   Thyroid disease     Patient Active Problem List   Diagnosis Date Noted   NSTEMI (non-ST elevated myocardial infarction) ([place]) 01/10/2021   Smoker 01/07/2021   Encounter for gynecological examination with Papanicolaou smear of cervix 12/24/2020   Urinary frequency 12/24/2020   Anxiety and depression 12/24/2020   Weight gain 12/24/2020   Night sweats 12/24/2020   Peri-menopause 12/24/2020   Screening mammogram for breast cancer 12/24/2020   Screening for colorectal cancer 12/24/2020   Encounter for screening fecal occult blood testing 12/24/2020   Tobacco abuse 10/06/2014   Hypokalemia 10/06/2014   Chronic low back pain 10/06/2014   Infective colitis 10/04/2014   Hypothyroid 06/25/2012    Anxiety 06/17/2012   Chronic back pain 06/17/2012   Menorrhagia 06/17/2012   Uterine enlargement 06/17/2012   Decreased libido 06/17/2012    Past Surgical History:  Procedure Laterality Date   BACK SURGERY     cyst removed   CHOLECYSTECTOMY  2004   ECTOPIC PREGNANCY SURGERY       OB History     Gravida  7   Para  2   Term  1   Preterm      AB  5   Living  1      SAB      IAB      Ectopic  5   Multiple      Live Births  1           Family History  Problem Relation Age of Onset   Diabetes Paternal Grandmother    Hypertension Father    Kidney Stones Brother    Hypertension Son    Hypertension Paternal Uncle    Hypertension Paternal Uncle     Social History   Tobacco Use   Smoking status: Every Day    Packs/day: 1.00    Years: 20.00    Pack years: 20.00    Types: Cigarettes    Last attempt to quit: 01/20/1994    Years since quitting: 26.9   Smokeless tobacco: Never  Vaping Use   Vaping Use: Never used  Substance Use Topics   Alcohol use: No   Drug use: No    Home  Medications Prior to Admission medications   Medication Sig Start Date End Date Taking? Authorizing Provider  ALPRAZolam Duanne Moron) 0.5 MG tablet Take 0.5 mg by mouth 2 (two) times daily as needed for anxiety.  08/04/14  Yes [provider]  Aspirin-Acetaminophen (GOODYS BODY PAIN PO) Take 1 packet by mouth 2 (two) times daily as needed (headache pain).   Yes [provider]  levothyroxine (SYNTHROID) 50 MCG tablet Take 50 mcg by mouth daily. 12/03/20  Yes [provider]  norethindrone (AYGESTIN) 5 MG tablet Take 1 tablet (5 mg total) by mouth daily. 01/09/21  Yes Derrek Monaco A, NP  sertraline (ZOLOFT) 25 MG tablet Take 25 mg by mouth daily. 01/08/21  Yes [provider]  atorvastatin (LIPITOR) 20 MG tablet Take 20 mg by mouth daily. Patient not taking: Reported on 01/10/2021 01/08/21   [provider]  citalopram (CELEXA) 10 MG  tablet Take 10 mg by mouth daily.  02/15/20  [provider]    Allergies    Latex  Review of Systems   Review of Systems  Cardiovascular:  Positive for chest pain.  All other systems reviewed and are negative.  Physical Exam Updated Vital Signs BP 118/84    Pulse (!) 109    Temp 97.9 F (36.6 C) (Oral)    Resp 20    LMP 01/05/2021    SpO2 95%   Physical Exam Vitals and nursing note reviewed.  Constitutional:      General: She is not in acute distress.    Appearance: Normal appearance.  HENT:     Head: Normocephalic and atraumatic.  Eyes:     General:        Right eye: No discharge.        Left eye: No discharge.  Cardiovascular:     Comments: Tachycardic. S1/S2 are distinct without any evidence of murmur, rubs, or gallops.  Radial pulses are 2+ bilaterally.  Dorsalis pedis pulses are 2+ bilaterally.  No evidence of pedal edema. Pulmonary:     Comments: Clear to auscultation bilaterally.  Normal effort.  No respiratory distress.  No evidence of wheezes, rales, or rhonchi heard throughout. Abdominal:     General: Abdomen is flat. Bowel sounds are normal. There is no distension.     Tenderness: There is no abdominal tenderness. There is no guarding or rebound.  Musculoskeletal:        General: Normal range of motion.     Cervical back: Neck supple.  Skin:    General: Skin is warm and dry.     Findings: No rash.  Neurological:     General: No focal deficit present.     Mental Status: She is alert.  Psychiatric:        Mood and Affect: Mood normal.        Behavior: Behavior normal.    ED Results / Procedures / Treatments   Labs (all labs ordered are listed, but only abnormal results are displayed) Labs Reviewed  BASIC METABOLIC PANEL - Abnormal; Notable for the following components:      Result Value   Potassium 2.9 (*)    CO2 20 (*)    Glucose, Bld 117 (*)    Calcium 8.8 (*)    All other components within normal limits  CBC - Abnormal; Notable for the  following components:   WBC 11.7 (*)    Platelets 482 (*)    All other components within normal limits  TROPONIN I (HIGH SENSITIVITY) -  Abnormal; Notable for the following components:   Troponin I (High Sensitivity) 909 (*)    All other components within normal limits  TROPONIN I (HIGH SENSITIVITY) - Abnormal; Notable for the following components:   Troponin I (High Sensitivity) 1,196 (*)    All other components within normal limits  POC URINE PREG, ED - Normal  RESP PANEL BY RT-PCR (FLU A&B, COVID) [id number]  HEPARIN LEVEL (UNFRACTIONATED)  APTT  CBC    EKG EKG Interpretation  Date/Time:  Thursday January 10 2021 17:23:14 EST Ventricular Rate:  114 PR Interval:  134 QRS Duration: 70 QT Interval:  330 QTC Calculation: 454 R Axis:   21 Text Interpretation: Sinus tachycardia Low voltage QRS Borderline ECG Confirmed by Thamas Jaegers ([id number]) on 01/10/2021 7:03:54 PM  Radiology DG Chest 2 View  Result Date: 01/10/2021 CLINICAL DATA:  chest pain EXAM: CHEST - 2 VIEW COMPARISON:  Chest x-ray 09/07/2012 FINDINGS: The heart and mediastinal contours are unchanged. Aortic calcification. No focal consolidation. No pulmonary edema. No pleural effusion. No pneumothorax. No acute osseous abnormality. IMPRESSION: No active cardiopulmonary disease. Electronically Signed   By: Iven Finn M.D.   On: 01/10/2021 17:50   CT Angio Chest PE W and/or Wo Contrast  Result Date: 01/10/2021 CLINICAL DATA:  Tachycardia EXAM: CT ANGIOGRAPHY CHEST WITH CONTRAST TECHNIQUE: Multidetector CT imaging of the chest was performed using the standard protocol during bolus administration of intravenous contrast. Multiplanar CT image reconstructions and MIPs were obtained to evaluate the vascular anatomy. CONTRAST:  10mL OMNIPAQUE IOHEXOL 350 MG/ML SOLN COMPARISON:  None. FINDINGS: Cardiovascular: Adequate opacification of the pulmonary arterial tree. No intraluminal filling defect identified to suggest acute pulmonary  embolism. Central pulmonary arteries are of normal caliber. No significant coronary artery calcification. Cardiac size within normal limits. No pericardial effusion. Mild atherosclerotic calcification within the thoracic aorta. No aortic aneurysm. Mediastinum/Nodes: No enlarged mediastinal, hilar, or axillary lymph nodes. Thyroid gland, trachea, and esophagus demonstrate no significant findings. Lungs/Pleura: There is patchy ground-glass pulmonary infiltrate within the perihilar left upper lobe which may be infectious or inflammatory in the acute setting. Minimal infiltrate is also noted within the right upper lobe. The lungs are otherwise clear. No pneumothorax or pleural effusion. Central airways are widely patent. Upper Abdomen: Cholecystectomy has been performed. No acute abnormality. Musculoskeletal: No acute bone abnormality. No lytic or blastic bone lesion. Review of the MIP images confirms the above findings. IMPRESSION: No pulmonary embolism. Multifocal pulmonary infiltrates, most severe within the left upper lobe, likely infectious or inflammatory in the acute setting. Aortic Atherosclerosis (ICD10-I70.0). Electronically Signed   By: Fidela Salisbury M.D.   On: 01/10/2021 20:16    Procedures .Critical Care Performed by: Hendricks Limes, PA-C Authorized by: Hendricks Limes, PA-C   Critical care provider statement:    Critical care time (minutes):  74   Critical care time was exclusive of:  Separately billable procedures and treating other patients   Critical care was necessary to treat or prevent imminent or life-threatening deterioration of the following conditions:  Cardiac failure and circulatory failure   Critical care was time spent personally by me on the following activities:  Ordering and performing treatments and interventions, ordering and review of laboratory studies, ordering and review of radiographic studies, re-evaluation of patient's condition and discussions with consultants    I assumed direction of critical care for this patient from another provider in my specialty: no     Care discussed with: admitting provider     Medications Ordered  in ED Medications  heparin ADULT infusion 100 units/mL (25000 units/227mL) (950 Units/hr Intravenous New Bag/Given 01/10/21 2106)  sodium chloride 0.9 % bolus 500 mL (0 mLs Intravenous Stopped 01/10/21 2103)  heparin bolus via infusion 4,000 Units (4,000 Units Intravenous Bolus from Bag 01/10/21 2107)  iohexol (OMNIPAQUE) 350 MG/ML injection 75 mL (75 mLs Intravenous Contrast Given 01/10/21 1958)  potassium chloride (KLOR-CON) packet 40 mEq (40 mEq Oral Given 01/10/21 2112)  ALPRAZolam Prudy Feeler) tablet 0.5 mg (0.5 mg Oral Given 01/10/21 2112)    ED Course  I have reviewed the triage vital signs and the nursing notes.  Pertinent labs & imaging results that were available during my care of the patient were reviewed by me and considered in my medical decision making (see chart for details).  Clinical Course as of 01/10/21 2144  Thu Jan 10, 2021  1950 I spoke with Dr. Bjorn Pippin with cardiology.  The plan is to wait till CTA comes back and if negative cardiology will admit at [facility].  In the meantime they recommend getting her pain-free. [CF]  2054 Spoke with Dr. Tresa Endo with cardiology with updated results. He agrees to admit the patient. Patient will be transferred to [facility].  [CF]    Clinical Course User Index [CF] Jolyn Lent   MDM Rules/Calculators/A&P                          JIMESHA RISING is a 45 y.o. female who presents the emergency department with left-sided chest pain.  History is concerning for ACS.  Initial work-up was ordered prior to me seeing the patient to include CBC, BMP, urine pride, and troponin.  EKG was personally reviewed by myself and my attending which showed sinus tachycardia with possible ST depressions which could be artifact.  Initial troponin was 900.  Patient was also persistently  tachycardic and now hypotensive.  A 500 mL bolus was ordered for the patient immediately in addition to a CT angiography to rule out possible pulmonary embolism.  Cardiology was consulted and awaiting their phone call.  Heparin was initiated.  I am unable to give her nitroglycerin at this time secondary to her blood pressure.  Patient's blood pressure improved after half a liter of fluids.  On reevaluation, patient is feeling improved.  I spoke with cardiology.  Plan highlighted above.  CBC reveals leukocytosis which is likely secondary to a stress response.  BMP shows hypokalemia.  This was repleted in the emergency department.  Delta troponin was almost 1200.  Chest x-ray was negative.  CT angio did not reveal any pulmonary embolism.  On reevaluation, patient is still having chest pressure.  Her blood pressure is improved.  We did talk about nitroglycerin as well as the risks and benefits.  Patient is feeling very anxious and wishes to have her as needed Xanax medication.  She wishes to hold off on nitroglycerin at this time.  Updated cardiology with CTA and second troponin findings.  They accept the patient.  She will be transferred to [facility] for further management   Final Clinical Impression(s) / ED Diagnoses Final diagnoses:  NSTEMI (non-ST elevated myocardial infarction) [facility])    Rx / DC Orders ED Discharge Orders     None        Jolyn Lent 01/10/21 2144    Cheryll Cockayne, MD 01/10/21 2235

## 2021-01-10 NOTE — ED Notes (Signed)
Attempted to call report X 1. 

## 2021-01-10 NOTE — Progress Notes (Signed)
ANTICOAGULATION CONSULT NOTE - Initial Consult  Pharmacy Consult for heparin infusion Indication: chest pain/ACS  Allergies  Allergen Reactions   Latex     Patient Measurements:   Heparin Dosing Weight: 71.2 kg  Vital Signs: Temp: 97.9 F (36.6 C) (12/22 1719) Temp Source: Oral (12/22 1719) BP: 89/39 (12/22 1840) Pulse Rate: 103 (12/22 1840)  Labs: Recent Labs    01/10/21 1755  HGB 13.3  HCT 40.4  PLT 482*  CREATININE 0.56  TROPONINIHS 909*    Estimated Creatinine Clearance: 90.6 mL/min (by C-G formula based on SCr of 0.56 mg/dL).   Medical History: Past Medical History:  Diagnosis Date   Anxiety    Chronic back pain 06/17/2012   Decreased libido 06/17/2012   Degenerative disc disease    Depression    Ectopic pregnancy, tubal    5 ectopic pregnancies   Hypothyroid 06/25/2012   TSH 5.561, will rx synthroid 25 mcg and recheck in 8 weeks   Kidney stones    Menorrhagia 06/17/2012   Thyroid disease     Medications:  (Not in a hospital admission)   Assessment:  45 year-old patient with an episode of chest pain starting last night placed on a heparin infusion for suspected ACS/chest pain  Goal of Therapy:  Heparin level 0.3-0.7 units/ml aPTT 66-102 seconds Monitor platelets by anticoagulation protocol: Yes   Plan:  Give 4000 units bolus x 1 Start heparin infusion at 950 units/hr Check anti-Xa level in 8 hours and daily while on heparin Continue to monitor H&H and platelets  Estaban Mainville BS, PharmD, BCPS Clinical Pharmacist 01/10/2021,7:34 PM

## 2021-01-11 ENCOUNTER — Inpatient Hospital Stay (HOSPITAL_COMMUNITY): Payer: 59

## 2021-01-11 ENCOUNTER — Other Ambulatory Visit: Payer: Self-pay

## 2021-01-11 ENCOUNTER — Encounter (HOSPITAL_COMMUNITY)
Admission: EM | Disposition: A | Discharge status: Home / Self Care | Payer: Self-pay | Source: Home / Self Care | Attending: Cardiovascular Disease

## 2021-01-11 ENCOUNTER — Encounter (HOSPITAL_COMMUNITY): Payer: Self-pay | Admitting: Interventional Cardiology

## 2021-01-11 DIAGNOSIS — Z720 Tobacco use: Secondary | ICD-10-CM

## 2021-01-11 DIAGNOSIS — I214 Non-ST elevation (NSTEMI) myocardial infarction: Principal | ICD-10-CM

## 2021-01-11 DIAGNOSIS — R7989 Other specified abnormal findings of blood chemistry: Secondary | ICD-10-CM

## 2021-01-11 DIAGNOSIS — I2510 Atherosclerotic heart disease of native coronary artery without angina pectoris: Secondary | ICD-10-CM | POA: Diagnosis not present

## 2021-01-11 DIAGNOSIS — R079 Chest pain, unspecified: Secondary | ICD-10-CM

## 2021-01-11 HISTORY — PX: LEFT HEART CATH AND CORONARY ANGIOGRAPHY: CATH118249

## 2021-01-11 LAB — CBC
HCT: 39.5 % (ref 36.0–46.0)
Hemoglobin: 13.5 g/dL (ref 12.0–15.0)
MCH: 31.3 pg (ref 26.0–34.0)
MCHC: 34.2 g/dL (ref 30.0–36.0)
MCV: 91.4 fL (ref 80.0–100.0)
Platelets: 443 10*3/uL — ABNORMAL HIGH (ref 150–400)
RBC: 4.32 MIL/uL (ref 3.87–5.11)
RDW: 13.9 % (ref 11.5–15.5)
WBC: 11 10*3/uL — ABNORMAL HIGH (ref 4.0–10.5)
nRBC: 0 % (ref 0.0–0.2)

## 2021-01-11 LAB — COMPREHENSIVE METABOLIC PANEL
ALT: 19 U/L (ref 0–44)
AST: 26 U/L (ref 15–41)
Albumin: 3.9 g/dL (ref 3.5–5.0)
Alkaline Phosphatase: 45 U/L (ref 38–126)
Anion gap: 8 (ref 5–15)
BUN: 5 mg/dL — ABNORMAL LOW (ref 6–20)
CO2: 23 mmol/L (ref 22–32)
Calcium: 9.2 mg/dL (ref 8.9–10.3)
Chloride: 107 mmol/L (ref 98–111)
Creatinine, Ser: 0.7 mg/dL (ref 0.44–1.00)
GFR, Estimated: >60 mL/min (ref >60)
Glucose, Bld: 87 mg/dL (ref 70–99)
Potassium: 3.4 mmol/L — ABNORMAL LOW (ref 3.5–5.1)
Sodium: 138 mmol/L (ref 135–145)
Total Bilirubin: 0.6 mg/dL (ref 0.3–1.2)
Total Protein: 6.8 g/dL (ref 6.5–8.1)

## 2021-01-11 LAB — HEMOGLOBIN A1C
Hgb A1c MFr Bld: 5.4 % (ref 4.8–5.6)
Mean Plasma Glucose: 108.28 mg/dL

## 2021-01-11 LAB — TROPONIN I (HIGH SENSITIVITY): Troponin I (High Sensitivity): 1161 ng/L (ref <18)

## 2021-01-11 LAB — LIPID PANEL
Cholesterol: 183 mg/dL (ref 0–200)
HDL: 49 mg/dL (ref >40)
LDL Cholesterol: 120 mg/dL — ABNORMAL HIGH (ref 0–99)
Total CHOL/HDL Ratio: 3.7 RATIO
Triglycerides: 71 mg/dL (ref <150)
VLDL: 14 mg/dL (ref 0–40)

## 2021-01-11 LAB — HEPARIN LEVEL (UNFRACTIONATED): Heparin Unfractionated: <0.1 IU/mL — ABNORMAL LOW (ref 0.30–0.70)

## 2021-01-11 LAB — ECHOCARDIOGRAM COMPLETE
Area-P 1/2: 4.15 cm2
Height: 63 in
S' Lateral: 3.2 cm
Single Plane A2C EF: 50.3 %
Weight: 2884.8 oz

## 2021-01-11 LAB — MAGNESIUM: Magnesium: 2 mg/dL (ref 1.7–2.4)

## 2021-01-11 LAB — TSH: TSH: 0.675 u[IU]/mL (ref 0.350–4.500)

## 2021-01-11 LAB — GLUCOSE, CAPILLARY
Glucose-Capillary: 93 mg/dL (ref 70–99)
Glucose-Capillary: 92 mg/dL (ref 70–99)
Glucose-Capillary: 84 mg/dL (ref 70–99)
Glucose-Capillary: 136 mg/dL — ABNORMAL HIGH (ref 70–99)

## 2021-01-11 LAB — APTT: aPTT: 33 seconds (ref 24–36)

## 2021-01-11 SURGERY — LEFT HEART CATH AND CORONARY ANGIOGRAPHY
Anesthesia: LOCAL

## 2021-01-11 MED ORDER — METOPROLOL TARTRATE 12.5 MG HALF TABLET
12.5000 mg | ORAL_TABLET | Freq: Four times a day (QID) | ORAL | Status: DC
  Administered 2021-01-11: 04:00:00 12.5 mg via ORAL
  Filled 2021-01-11 (×2): qty 1

## 2021-01-11 MED ORDER — LIDOCAINE HCL (PF) 1 % IJ SOLN
INTRAMUSCULAR | Status: AC
  Filled 2021-01-11: qty 30

## 2021-01-11 MED ORDER — SODIUM CHLORIDE 0.9 % IV SOLN: 250.0000 mL | INTRAVENOUS | Status: DC | PRN

## 2021-01-11 MED ORDER — ONDANSETRON HCL 4 MG/2ML IJ SOLN: 4.0000 mg | Freq: Four times a day (QID) | INTRAMUSCULAR | Status: DC | PRN

## 2021-01-11 MED ORDER — METOPROLOL TARTRATE 12.5 MG HALF TABLET
12.5000 mg | ORAL_TABLET | Freq: Two times a day (BID) | ORAL | Status: DC
  Administered 2021-01-11 – 2021-01-12 (×2): 12.5 mg via ORAL
  Filled 2021-01-11 (×2): qty 1

## 2021-01-11 MED ORDER — ASPIRIN 81 MG PO CHEW
81.0000 mg | CHEWABLE_TABLET | ORAL | Status: AC
  Administered 2021-01-11: 09:00:00 81 mg via ORAL
  Filled 2021-01-11: qty 1

## 2021-01-11 MED ORDER — NICOTINE POLACRILEX 2 MG MT GUM
2.0000 mg | CHEWING_GUM | OROMUCOSAL | Status: DC | PRN
  Administered 2021-01-12: 04:00:00 2 mg via ORAL
  Filled 2021-01-11 (×2): qty 1

## 2021-01-11 MED ORDER — ACETAMINOPHEN 325 MG PO TABS: 650.0000 mg | ORAL_TABLET | ORAL | Status: DC | PRN

## 2021-01-11 MED ORDER — HEPARIN BOLUS VIA INFUSION
3000.0000 [IU] | Freq: Once | INTRAVENOUS | Status: AC
  Administered 2021-01-11: 05:00:00 3000 [IU] via INTRAVENOUS
  Filled 2021-01-11: qty 3000

## 2021-01-11 MED ORDER — HEPARIN (PORCINE) IN NACL 1000-0.9 UT/500ML-% IV SOLN
INTRAVENOUS | Status: AC
  Filled 2021-01-11: qty 1000

## 2021-01-11 MED ORDER — SODIUM CHLORIDE 0.9 % WEIGHT BASED INFUSION
3.0000 mL/kg/h | INTRAVENOUS | Status: DC
  Administered 2021-01-11: 09:00:00 3 mL/kg/h via INTRAVENOUS

## 2021-01-11 MED ORDER — ASPIRIN EC 81 MG PO TBEC
81.0000 mg | DELAYED_RELEASE_TABLET | Freq: Every day | ORAL | Status: DC
  Administered 2021-01-12: 08:00:00 81 mg via ORAL
  Filled 2021-01-11: qty 1

## 2021-01-11 MED ORDER — HYDRALAZINE HCL 20 MG/ML IJ SOLN: 10.0000 mg | INTRAMUSCULAR | Status: AC | PRN

## 2021-01-11 MED ORDER — SODIUM CHLORIDE 0.9% FLUSH: 3.0000 mL | INTRAVENOUS | Status: DC | PRN

## 2021-01-11 MED ORDER — NICOTINE 21 MG/24HR TD PT24
21.0000 mg | MEDICATED_PATCH | Freq: Once | TRANSDERMAL | Status: AC
  Administered 2021-01-11: 21 mg via TRANSDERMAL
  Filled 2021-01-11: qty 1

## 2021-01-11 MED ORDER — LIDOCAINE HCL (PF) 1 % IJ SOLN
INTRAMUSCULAR | Status: DC | PRN
  Administered 2021-01-11 (×2): 2 mL

## 2021-01-11 MED ORDER — NITROGLYCERIN 2 % TD OINT
0.5000 [in_us] | TOPICAL_OINTMENT | Freq: Four times a day (QID) | TRANSDERMAL | Status: DC
Start: 2021-01-11 — End: 2021-01-11

## 2021-01-11 MED ORDER — HEPARIN (PORCINE) IN NACL 1000-0.9 UT/500ML-% IV SOLN
INTRAVENOUS | Status: DC | PRN
  Administered 2021-01-11 (×2): 500 mL

## 2021-01-11 MED ORDER — LEVOTHYROXINE SODIUM 50 MCG PO TABS
50.0000 ug | ORAL_TABLET | Freq: Every day | ORAL | Status: DC
  Administered 2021-01-11 – 2021-01-12 (×2): 50 ug via ORAL
  Filled 2021-01-11 (×2): qty 1

## 2021-01-11 MED ORDER — LOSARTAN POTASSIUM 25 MG PO TABS
12.5000 mg | ORAL_TABLET | Freq: Every day | ORAL | Status: DC
  Administered 2021-01-12: 08:00:00 12.5 mg via ORAL
  Filled 2021-01-11: qty 1

## 2021-01-11 MED ORDER — VERAPAMIL HCL 2.5 MG/ML IV SOLN
INTRAVENOUS | Status: DC | PRN
  Administered 2021-01-11: 11:00:00 10 mL via INTRA_ARTERIAL

## 2021-01-11 MED ORDER — IOHEXOL 350 MG/ML SOLN
INTRAVENOUS | Status: DC | PRN
  Administered 2021-01-11: 11:00:00 50 mL

## 2021-01-11 MED ORDER — SERTRALINE HCL 50 MG PO TABS
25.0000 mg | ORAL_TABLET | Freq: Every day | ORAL | Status: DC
  Filled 2021-01-11 (×2): qty 1

## 2021-01-11 MED ORDER — VERAPAMIL HCL 2.5 MG/ML IV SOLN
INTRAVENOUS | Status: AC
  Filled 2021-01-11: qty 2

## 2021-01-11 MED ORDER — MIDAZOLAM HCL 2 MG/2ML IJ SOLN
INTRAMUSCULAR | Status: DC | PRN
  Administered 2021-01-11 (×2): 1 mg via INTRAVENOUS

## 2021-01-11 MED ORDER — SODIUM CHLORIDE 0.9 % WEIGHT BASED INFUSION: 1.0000 mL/kg/h | INTRAVENOUS | Status: DC

## 2021-01-11 MED ORDER — SODIUM CHLORIDE 0.9 % IV SOLN: INTRAVENOUS | Status: AC

## 2021-01-11 MED ORDER — ASPIRIN 81 MG PO CHEW: 81.0000 mg | CHEWABLE_TABLET | Freq: Every day | ORAL | Status: DC

## 2021-01-11 MED ORDER — ENOXAPARIN SODIUM 40 MG/0.4ML IJ SOSY
40.0000 mg | PREFILLED_SYRINGE | INTRAMUSCULAR | Status: DC
  Filled 2021-01-11: qty 0.4

## 2021-01-11 MED ORDER — HEPARIN SODIUM (PORCINE) 1000 UNIT/ML IJ SOLN
INTRAMUSCULAR | Status: DC | PRN
  Administered 2021-01-11: 4000 [IU] via INTRAVENOUS

## 2021-01-11 MED ORDER — MIDAZOLAM HCL 2 MG/2ML IJ SOLN
INTRAMUSCULAR | Status: AC
  Filled 2021-01-11: qty 2

## 2021-01-11 MED ORDER — POTASSIUM CHLORIDE CRYS ER 20 MEQ PO TBCR
40.0000 meq | EXTENDED_RELEASE_TABLET | Freq: Four times a day (QID) | ORAL | Status: AC
  Administered 2021-01-11 (×3): 40 meq via ORAL
  Filled 2021-01-11 (×3): qty 2

## 2021-01-11 MED ORDER — LABETALOL HCL 5 MG/ML IV SOLN: 10.0000 mg | INTRAVENOUS | Status: AC | PRN

## 2021-01-11 MED ORDER — SODIUM CHLORIDE 0.9% FLUSH
3.0000 mL | Freq: Two times a day (BID) | INTRAVENOUS | Status: DC
  Administered 2021-01-11: 23:00:00 3 mL via INTRAVENOUS

## 2021-01-11 MED ORDER — HEPARIN SODIUM (PORCINE) 1000 UNIT/ML IJ SOLN
INTRAMUSCULAR | Status: AC
  Filled 2021-01-11: qty 10

## 2021-01-11 MED ORDER — FENTANYL CITRATE (PF) 100 MCG/2ML IJ SOLN
INTRAMUSCULAR | Status: DC | PRN
  Administered 2021-01-11 (×2): 25 ug via INTRAVENOUS

## 2021-01-11 MED ORDER — LORAZEPAM 1 MG PO TABS
1.0000 mg | ORAL_TABLET | Freq: Two times a day (BID) | ORAL | Status: DC | PRN
  Administered 2021-01-11 (×2): 1 mg via ORAL
  Filled 2021-01-11 (×3): qty 1

## 2021-01-11 MED ORDER — FENTANYL CITRATE (PF) 100 MCG/2ML IJ SOLN
INTRAMUSCULAR | Status: AC
  Filled 2021-01-11: qty 2

## 2021-01-11 MED ORDER — SODIUM CHLORIDE 0.9% FLUSH: 3.0000 mL | Freq: Two times a day (BID) | INTRAVENOUS | Status: DC

## 2021-01-11 MED ORDER — ROSUVASTATIN CALCIUM 20 MG PO TABS
20.0000 mg | ORAL_TABLET | Freq: Every day | ORAL | Status: DC
  Administered 2021-01-12: 08:00:00 20 mg via ORAL
  Filled 2021-01-11: qty 1

## 2021-01-11 SURGICAL SUPPLY — 12 items
CATH INFINITI 5 FR JL3.5 (CATHETERS) ×2 IMPLANT
CATH INFINITI 5FR MULTPACK ANG (CATHETERS) ×2 IMPLANT
CATH INFINITI JR4 5F (CATHETERS) ×2 IMPLANT
DEVICE RAD COMP TR BAND LRG (VASCULAR PRODUCTS) ×2 IMPLANT
GLIDESHEATH SLEND A-KIT 6F 22G (SHEATH) ×2 IMPLANT
GUIDEWIRE INQWIRE 1.5J.035X260 (WIRE) IMPLANT
INQWIRE 1.5J .035X260CM (WIRE) ×3
KIT HEART LEFT (KITS) ×4 IMPLANT
PACK CARDIAC CATHETERIZATION (CUSTOM PROCEDURE TRAY) ×4 IMPLANT
SHEATH PROBE COVER 6X72 (BAG) ×2 IMPLANT
TRANSDUCER W/STOPCOCK (MISCELLANEOUS) ×4 IMPLANT
TUBING CIL FLEX 10 FLL-RA (TUBING) ×4 IMPLANT

## 2021-01-11 NOTE — Interval H&P Note (Signed)
Cath Lab Visit (complete for each Cath Lab visit)  Clinical Evaluation Leading to the Procedure:   ACS: Yes.    Non-ACS:    Anginal Classification: CCS III  Anti-ischemic medical therapy: Minimal Therapy (1 class of medications)  Non-Invasive Test Results: No non-invasive testing performed  Prior CABG: No previous CABG      History and Physical Interval Note:  01/11/2021 10:30 AM  Sheri Vargas  has presented today for surgery, with the diagnosis of nstemi.  The various methods of treatment have been discussed with the patient and family. After consideration of risks, benefits and other options for treatment, the patient has consented to  Procedure(s): LEFT HEART CATH AND CORONARY ANGIOGRAPHY (N/A) as a surgical intervention.  The patient's history has been reviewed, patient examined, no change in status, stable for surgery.  I have reviewed the patient's chart and labs.  Questions were answered to the patient's satisfaction.     Lyn Records III

## 2021-01-11 NOTE — ED Notes (Signed)
[facility] at bedside to transport pt at this time 

## 2021-01-11 NOTE — Progress Notes (Signed)
°  Echocardiogram 2D Echocardiogram has been performed.  Delcie Roch 01/11/2021, 2:31 PM

## 2021-01-11 NOTE — H&P (View-Only) (Signed)
Progress Note  Patient Name: Sheri Vargas Date of Encounter: 01/11/2021  [facility] Cardiologist: None New  Subjective   Still with 4/10 ongoing chest discomfort.   Inpatient Medications    Scheduled Meds:  aspirin  81 mg Oral Pre-Cath   aspirin EC  81 mg Oral Daily   levothyroxine  50 mcg Oral Q0600   metoprolol tartrate  12.5 mg Oral Q6H   nicotine  21 mg Transdermal Once   potassium chloride  40 mEq Oral Q6H   [START ON 01/12/2021] rosuvastatin  20 mg Oral Daily   sertraline  25 mg Oral Daily   Continuous Infusions:  sodium chloride     Followed by   sodium chloride     heparin 1,250 Units/hr (01/11/21 0507)   PRN Meds: LORazepam, nicotine polacrilex   Vital Signs    Vitals:   01/11/21 0023 01/11/21 0025 01/11/21 0155 01/11/21 0554  BP: 110/67 110/67 123/80 101/73  Pulse: 86 93 89 65  Resp: 16 14 14 15   Temp:  98.2 F (36.8 C) 98.2 F (36.8 C) 98.2 F (36.8 C)  TempSrc:  Oral Oral Oral  SpO2: 97% 100% 95% 93%  Weight:   81.8 kg   Height:   5\' 3"  (1.6 m)    No intake or output data in the 24 hours ending 01/11/21 0719 Last 3 Weights 01/11/2021 01/07/2021 12/24/2020  Weight (lbs) 180 lb 4.8 oz 186 lb 8 oz 193 lb  Weight (kg) 81.784 kg 84.596 kg 87.544 kg      Telemetry    SR - Personally Reviewed  ECG    SR - Personally Reviewed  Physical Exam   GEN: No acute distress.   Neck: No JVD Cardiac: RRR, no murmurs, rubs, or gallops.  Respiratory: Clear to auscultation bilaterally. GI: Soft, nontender, non-distended  MS: No edema; No deformity. Neuro:  Nonfocal  Psych: Normal affect   Labs    High Sensitivity Troponin:   Recent Labs  Lab 01/10/21 1755 01/10/21 1949 01/11/21 0342  TROPONINIHS 909* 1,196* 1,161*     Chemistry Recent Labs  Lab 01/10/21 1755 01/11/21 0342  NA 137 138  K 2.9* 3.4*  CL 103 107  CO2 20* 23  GLUCOSE 117* 87  BUN 11 5*  CREATININE 0.56 0.70  CALCIUM 8.8* 9.2  MG  --  2.0  PROT  --  6.8   ALBUMIN  --  3.9  AST  --  26  ALT  --  19  ALKPHOS  --  45  BILITOT  --  0.6  GFRNONAA >60 >60  ANIONGAP 14 8    Lipids  Recent Labs  Lab 01/11/21 0342  CHOL 183  TRIG 71  HDL 49  LDLCALC 120*  CHOLHDL 3.7    Hematology Recent Labs  Lab 01/10/21 1755  WBC 11.7*  RBC 4.34  HGB 13.3  HCT 40.4  MCV 93.1  MCH 30.6  MCHC 32.9  RDW 13.9  PLT 482*   Thyroid  Recent Labs  Lab 01/11/21 0342  TSH 0.675    BNPNo results for input(s): BNP, PROBNP in the last 168 hours.  DDimer No results for input(s): DDIMER in the last 168 hours.   Radiology    DG Chest 2 View  Result Date: 01/10/2021 CLINICAL DATA:  chest pain EXAM: CHEST - 2 VIEW COMPARISON:  Chest x-ray 09/07/2012 FINDINGS: The heart and mediastinal contours are unchanged. Aortic calcification. No focal consolidation. No pulmonary edema. No pleural effusion. No  pneumothorax. No acute osseous abnormality. IMPRESSION: No active cardiopulmonary disease. Electronically Signed   By: Tish Frederickson M.D.   On: 01/10/2021 17:50   CT Angio Chest PE W and/or Wo Contrast  Result Date: 01/10/2021 CLINICAL DATA:  Tachycardia EXAM: CT ANGIOGRAPHY CHEST WITH CONTRAST TECHNIQUE: Multidetector CT imaging of the chest was performed using the standard protocol during bolus administration of intravenous contrast. Multiplanar CT image reconstructions and MIPs were obtained to evaluate the vascular anatomy. CONTRAST:  60mL OMNIPAQUE IOHEXOL 350 MG/ML SOLN COMPARISON:  None. FINDINGS: Cardiovascular: Adequate opacification of the pulmonary arterial tree. No intraluminal filling defect identified to suggest acute pulmonary embolism. Central pulmonary arteries are of normal caliber. No significant coronary artery calcification. Cardiac size within normal limits. No pericardial effusion. Mild atherosclerotic calcification within the thoracic aorta. No aortic aneurysm. Mediastinum/Nodes: No enlarged mediastinal, hilar, or axillary lymph nodes.  Thyroid gland, trachea, and esophagus demonstrate no significant findings. Lungs/Pleura: There is patchy ground-glass pulmonary infiltrate within the perihilar left upper lobe which may be infectious or inflammatory in the acute setting. Minimal infiltrate is also noted within the right upper lobe. The lungs are otherwise clear. No pneumothorax or pleural effusion. Central airways are widely patent. Upper Abdomen: Cholecystectomy has been performed. No acute abnormality. Musculoskeletal: No acute bone abnormality. No lytic or blastic bone lesion. Review of the MIP images confirms the above findings. IMPRESSION: No pulmonary embolism. Multifocal pulmonary infiltrates, most severe within the left upper lobe, likely infectious or inflammatory in the acute setting. Aortic Atherosclerosis (ICD10-I70.0). Electronically Signed   By: Helyn Numbers M.D.   On: 01/10/2021 20:16    Cardiac Studies   N/a   Patient Profile     45 y.o. female with obesity, tobacco use, hypothyroidism who presented with chest pain and found to have a NSTEMI.   Assessment & Plan    NSTEMI: hsTn up to 1196. Still with ongoing 4/10 chest discomfort. Will plan for cardiac cath today.  -- continue IV heparin, ASA, statin, low dose metoprolol  -- add nitropaste -- echo pending  Shared Decision Making/Informed Consent{ The risks [stroke (1 in 1000), death (1 in 1000), kidney failure [usually temporary] (1 in 500), bleeding (1 in 200), allergic reaction [possibly serious] (1 in 200)], benefits (diagnostic support and management of coronary artery disease) and alternatives of a cardiac catheterization were discussed in detail with Ms. Lazenby and she is willing to proceed.  Tobacco use: cessation advised  HLD: LDL 120 -- now on high dose statin  Anxiety:  -- Home sertraline 25 mg daily -- Ativan 1 mg q12h po  Hypothyroidism: TSH 0.675 -- continue synthroid  For questions or updates, please contact [facility] Please  consult [email or web address] for contact info under    Signed, Laverda Page, NP  01/11/2021, 7:19 AM    Agree with note by Laverda Page NP-C  Patient with positive risk factors admitted with non-STEMI.  Her troponins are 1196.  She is on IV heparin and nitroglycerin.  Her EKG shows no acute changes.  Her exam is benign.  The problems include tobacco abuse and hyperlipidemia as well as anxiety.  She is scheduled for diagnostic cardiac catheterization this morning.  Runell Gess, M.D., FACP, [facility], Earl Lagos [facility] [address] [place], [place]  [address]  [phone] 01/11/2021 9:35 AM

## 2021-01-11 NOTE — CV Procedure (Signed)
Normal coronary arteries Anterobasal akinesis/severe hypokinesis.  EF 50%.  LVEDP 22 mmHg consistent with acute combined systolic and diastolic heart failure. Management should include beta-blocker therapy, and management of heart failure with diuresis if needed.  Hopefully there will be spontaneous recovery of the regional wall motion abnormality which could be on the basis of myocarditis with regional involvement versus atypical Takotsubo syndrome.

## 2021-01-11 NOTE — Progress Notes (Signed)
Pt admitted from [facility] transported by the [facility] team. Pt is alert and oriented x 4. Pt complains of chest pain with pain scale 5/10. EKG done. Pt refused Nitro SL for chest pain. Dr. Julianne Handler (on call) made aware of this admission. Awaiting MD's call back. Will continue to monitor pt.   01/11/21 0155  Vitals  Temp 98.2 F (36.8 C)  Temp Source Oral  BP 123/80  MAP (mmHg) 94  BP Location Left Arm  BP Method Automatic  Patient Position (if appropriate) Lying  Pulse Rate 89  ECG Heart Rate 85  Resp 14  MEWS COLOR  MEWS Score Color Green  Oxygen Therapy  SpO2 95 %  O2 Device Room Air  Pain Assessment  Pain Scale 0-10  Pain Score 5  Pain Type Acute pain  Pain Location Chest  Pain Descriptors / Indicators Dull;Aching  Pain Frequency Intermittent  Pain Onset On-going  Patients Stated Pain Goal 0  Pain Intervention(s) MD notified (Comment)  Multiple Pain Sites No  Height and Weight  Height 5\' 3"  (1.6 m)  Weight 81.8 kg  Type of Scale Used Standing  Type of Weight Actual  BSA (Calculated - sq m) 1.91 sq meters  BMI (Calculated) 31.95  Weight in (lb) to have BMI = 25 140.8  MEWS Score  MEWS Temp 0  MEWS Systolic 0  MEWS Pulse 0  MEWS RR 0  MEWS LOC 0  MEWS Score 0

## 2021-01-11 NOTE — Progress Notes (Addendum)
ANTICOAGULATION CONSULT NOTE - Follow Up Consult  Pharmacy Consult for heparin Indication: chest pain/ACS  Labs: Recent Labs    01/10/21 1755 01/10/21 1949 01/11/21 0342  HGB 13.3  --   --   HCT 40.4  --   --   PLT 482*  --   --   CREATININE 0.56  --  0.70  TROPONINIHS 909* 1,196*  --     Assessment: 45yo female subtherapeutic on heparin with initial dosing for CP (level <0.1, not showing in Epic results but confirmed with lab); no infusion issues or signs of bleeding per RN.  Goal of Therapy:  Heparin level 0.3-0.7 units/ml   Plan:  Will rebolus with heparin 3000 units and increase heparin infusion by 4 units/kg/hr to 1250 units/hr and check level in 6 hours.    Vernard Gambles, PharmD, BCPS  01/11/2021,5:03 AM

## 2021-01-11 NOTE — H&P (Addendum)
Cardiology Admission History and Physical:   Patient ID: Sheri Vargas MRN: [id number]; DOB: [birth year]   Admission date: 01/10/2021  PCP:  Benita Stabile, MD   [facility] Providers Cardiologist:  None        Chief Complaint: Chest pain  Patient Profile:   Sheri Vargas is a 45 y.o. female with obesity, smoking, hypothyroidsim, ongoing smoking who is being seen 01/11/2021 for the evaluation of chest pain.  History of Present Illness:   Sheri Vargas is a 45 year old female with past medical history as above who developed chest pain 36 hours ago.  She woke up yesterday morning with persistent chest discomfort and pressure radiating down her left arm.  She tried to push through it but was unable to and ultimately presented to the ED.  She took two [facility] goody powders prior to presenting.  The chest pain has been off and on since then but is minimal at this point.  She was given aspirin and started on a heparin infusion and transferred to our facility after a CT ruled out pulmonary embolism.  She explains to me that she has been under a lot of stress lately.  She was diagnosed with hypothyroidism and started on synthroid about two months ago which improved some of her symptoms but, given persistent depressive symtpoms was started on zoloft earlier this week.  This had an activating effect and improved her mood.  She is quite distraught during my interview though and intermittently, visibly tearful.  She has multiple stressors including the death of her father this year.  The chest pain is not clearly exertional or relieved by rest.  She has refused nitroglycerin out of concern for a headache; she suffers with them frequently.  Denies shortness of breath or orthopnea.  She has intermittent lower extremity edema but it's not been worse of late.  She has occasional palpitations.  Her smoking is longstanding and she still smokes 2 packs a day.  Past Medical History:  Diagnosis Date    Anxiety    Chronic back pain 06/17/2012   Decreased libido 06/17/2012   Degenerative disc disease    Depression    Ectopic pregnancy, tubal    5 ectopic pregnancies   Hypothyroid 06/25/2012   TSH 5.561, will rx synthroid 25 mcg and recheck in 8 weeks   Kidney stones    Menorrhagia 06/17/2012   Thyroid disease     Past Surgical History:  Procedure Laterality Date   BACK SURGERY     cyst removed   CHOLECYSTECTOMY  2004   ECTOPIC PREGNANCY SURGERY       Medications Prior to Admission: Prior to Admission medications   Medication Sig Start Date End Date Taking? Authorizing Provider  ALPRAZolam Prudy Feeler) 0.5 MG tablet Take 0.5 mg by mouth 2 (two) times daily as needed for anxiety.  08/04/14  Yes [provider]  Aspirin-Acetaminophen (GOODYS BODY PAIN PO) Take 1 packet by mouth 2 (two) times daily as needed (headache pain).   Yes [provider]  levothyroxine (SYNTHROID) 50 MCG tablet Take 50 mcg by mouth daily. 12/03/20  Yes [provider]  norethindrone (AYGESTIN) 5 MG tablet Take 1 tablet (5 mg total) by mouth daily. 01/09/21  Yes Cyril Mourning A, NP  sertraline (ZOLOFT) 25 MG tablet Take 25 mg by mouth daily. 01/08/21  Yes [provider]  atorvastatin (LIPITOR) 20 MG tablet Take 20 mg by mouth daily. Patient not taking: Reported on 01/10/2021 01/08/21  [provider]  citalopram (CELEXA) 10 MG tablet Take 10 mg by mouth daily.  02/15/20  [provider]     Allergies:    Allergies  Allergen Reactions   Latex     Social History:   Social History   Socioeconomic History   Marital status: Married    Spouse name: Not on file   Number of children: 3   Years of education: Not on file   Highest education level: Not on file  Occupational History   Not on file  Tobacco Use   Smoking status: Every Day    Packs/day: 1.00    Years: 20.00    Pack years: 20.00    Types: Cigarettes    Last attempt to quit: 01/20/1994     Years since quitting: 26.9   Smokeless tobacco: Never  Vaping Use   Vaping Use: Never used  Substance and Sexual Activity   Alcohol use: No   Drug use: No   Sexual activity: Not Currently    Birth control/protection: None, Abstinence  Other Topics Concern   Not on file  Social History Narrative   Not on file   Social Determinants of Health   Financial Resource Strain: High Risk   Difficulty of Paying Living Expenses: Very hard  Food Insecurity: No Food Insecurity   Worried About Programme researcher, broadcasting/film/video in the Last Year: Never true   Ran Out of Food in the Last Year: Never true  Transportation Needs: Unmet Transportation Needs   Lack of Transportation (Medical): Yes   Lack of Transportation (Non-Medical): Yes  Physical Activity: Insufficiently Active   Days of Exercise per Week: 2 days   Minutes of Exercise per Session: 10 min  Stress: Stress Concern Present   Feeling of Stress : Very much  Social Connections: Moderately Integrated   Frequency of Communication with Friends and Family: More than three times a week   Frequency of Social Gatherings with Friends and Family: Once a week   Attends Religious Services: 1 to 4 times per year   Active Member of [employer] or Organizations: No   Attends Banker Meetings: Never   Marital Status: Married  Catering manager Violence: Unknown   Fear of Current or Ex-Partner: Patient refused   Emotionally Abused: Patient refused   Physically Abused: No   Sexually Abused: No    Family History:   Her father and multiple brothers have heart issues including premature CAD. The patient's family history includes Diabetes in her paternal grandmother; Hypertension in her father, paternal uncle, paternal uncle, and son; Kidney Stones in her brother.    ROS:  Please see the history of present illness.  All other ROS reviewed and negative.     Physical Exam/Data:   Vitals:   01/10/21 2345 01/11/21 0023 01/11/21 0025 01/11/21 0155  BP:   110/67 110/67 123/80  Pulse: 96 86 93 89  Resp: (!) 28 16 14 14   Temp:   98.2 F (36.8 C) 98.2 F (36.8 C)  TempSrc:   Oral Oral  SpO2: 94% 97% 100% 95%  Weight:    81.8 kg  Height:    5\' 3"  (1.6 m)   No intake or output data in the 24 hours ending 01/11/21 0334 Last 3 Weights 01/11/2021 01/07/2021 12/24/2020  Weight (lbs) 180 lb 4.8 oz 186 lb 8 oz 193 lb  Weight (kg) 81.784 kg 84.596 kg 87.544 kg     Body mass index is 31.94 kg/m.  General:  Well nourished, well developed, in no acute distress HEENT: normal Neck: no JVD Vascular: No carotid bruits.  PT/DP are not palpable.  Palpable radial bilaterally.  No femoral bruits. Cardiac:  normal S1, S2; RRR; no murmur  Lungs:  clear to auscultation bilaterally, no wheezing, rhonchi or rales  Abd: soft, nontender, no hepatomegaly  Ext: no edema Musculoskeletal:  No deformities, BUE and BLE strength normal and equal Skin: warm and dry  Neuro:  CNs 2-12 intact, no focal abnormalities noted Psych:  Normal affect   EKG:  ECG shows normal sinus rhtyhm; normal ECG.  Relevant CV Studies: None  Laboratory Data:  High Sensitivity Troponin:   Recent Labs  Lab 01/10/21 1755 01/10/21 1949  TROPONINIHS 909* 1,196*      Chemistry Recent Labs  Lab 01/10/21 1755  NA 137  K 2.9*  CL 103  CO2 20*  GLUCOSE 117*  BUN 11  CREATININE 0.56  CALCIUM 8.8*  GFRNONAA >60  ANIONGAP 14    No results for input(s): PROT, ALBUMIN, AST, ALT, ALKPHOS, BILITOT in the last 168 hours. Lipids No results for input(s): CHOL, TRIG, HDL, LABVLDL, LDLCALC, CHOLHDL in the last 168 hours. Hematology Recent Labs  Lab 01/10/21 1755  WBC 11.7*  RBC 4.34  HGB 13.3  HCT 40.4  MCV 93.1  MCH 30.6  MCHC 32.9  RDW 13.9  PLT 482*   Thyroid No results for input(s): TSH, FREET4 in the last 168 hours. BNPNo results for input(s): BNP, PROBNP in the last 168 hours.  DDimer No results for input(s): DDIMER in the last 168 hours.   Radiology/Studies:  DG  Chest 2 View  Result Date: 01/10/2021 CLINICAL DATA:  chest pain EXAM: CHEST - 2 VIEW COMPARISON:  Chest x-ray 09/07/2012 FINDINGS: The heart and mediastinal contours are unchanged. Aortic calcification. No focal consolidation. No pulmonary edema. No pleural effusion. No pneumothorax. No acute osseous abnormality. IMPRESSION: No active cardiopulmonary disease. Electronically Signed   By: Tish Frederickson M.D.   On: 01/10/2021 17:50   CT Angio Chest PE W and/or Wo Contrast  Result Date: 01/10/2021 CLINICAL DATA:  Tachycardia EXAM: CT ANGIOGRAPHY CHEST WITH CONTRAST TECHNIQUE: Multidetector CT imaging of the chest was performed using the standard protocol during bolus administration of intravenous contrast. Multiplanar CT image reconstructions and MIPs were obtained to evaluate the vascular anatomy. CONTRAST:  75mL OMNIPAQUE IOHEXOL 350 MG/ML SOLN COMPARISON:  None. FINDINGS: Cardiovascular: Adequate opacification of the pulmonary arterial tree. No intraluminal filling defect identified to suggest acute pulmonary embolism. Central pulmonary arteries are of normal caliber. No significant coronary artery calcification. Cardiac size within normal limits. No pericardial effusion. Mild atherosclerotic calcification within the thoracic aorta. No aortic aneurysm. Mediastinum/Nodes: No enlarged mediastinal, hilar, or axillary lymph nodes. Thyroid gland, trachea, and esophagus demonstrate no significant findings. Lungs/Pleura: There is patchy ground-glass pulmonary infiltrate within the perihilar left upper lobe which may be infectious or inflammatory in the acute setting. Minimal infiltrate is also noted within the right upper lobe. The lungs are otherwise clear. No pneumothorax or pleural effusion. Central airways are widely patent. Upper Abdomen: Cholecystectomy has been performed. No acute abnormality. Musculoskeletal: No acute bone abnormality. No lytic or blastic bone lesion. Review of the MIP images confirms the  above findings. IMPRESSION: No pulmonary embolism. Multifocal pulmonary infiltrates, most severe within the left upper lobe, likely infectious or inflammatory in the acute setting. Aortic Atherosclerosis (ICD10-I70.0). Electronically Signed   By: Helyn Numbers M.D.   On: 01/10/2021 20:16  Assessment and Plan:   45 year old female presenting with chest pain and elevated troponin.  ECG is normal.  She has high probability for CAD based on smoking history and family history of premature ASCVD.  Recent synthroid initiation may have provoked symptoms.  I discussed with her regarding cardiac catheterization and she is amenable.  She has significant anxiety and will start her on some anxiolytics here (ativan) in place of her home alprazolam.  Differential includes stress cardiomyopathy given recent stressors but ruling out obstructive CAD first is prudent.  Problem list Chest pain Elevated troponin Smoking Anxiety disorder Obesity Hypothyroidism Hypokalemia  #Chest pain #Elevated troponin - ASA 81 mg daily - Heparin, ACS nomogram - Rosuvastatin 40 mg (poorly tolerated atorvastatin) - Metoprolol 12.5 mg q6h (borderline sinus tachycardia; no heart failure on exam) - NPO for catheterization - TTE - Risk stratification with AM labs  Smoking - Counseled regarding importance of cessation - nicotine patch; gum prescribed  Anxiety disorder - Home sertraline 25 mg daily - Ativan 1 mg q12h po  Obesity - Discussed lifestyle modification  Hypothyroidism - Synthroid 50 mcg - TSH in AM; may need adjustment  Risk Assessment/Risk Scores:    TIMI Risk Score for Unstable Angina or Non-ST Elevation MI:   The patient's TIMI risk score is 4, which indicates a 20% risk of all cause mortality, new or recurrent myocardial infarction or need for urgent revascularization in the next 14 days.{  Severity of Illness: The appropriate patient status for this patient is INPATIENT. Inpatient status is  judged to be reasonable and necessary in order to provide the required intensity of service to ensure the patient's safety. The patient's presenting symptoms, physical exam findings, and initial radiographic and laboratory data in the context of their chronic comorbidities is felt to place them at high risk for further clinical deterioration. Furthermore, it is not anticipated that the patient will be medically stable for discharge from the hospital within 2 midnights of admission.   * I certify that at the point of admission it is my clinical judgment that the patient will require inpatient hospital care spanning beyond 2 midnights from the point of admission due to high intensity of service, high risk for further deterioration and high frequency of surveillance required.*   For questions or updates, please contact [facility] Please consult [email or web address] for contact info under     Signed, Regino Schultze, MD  01/11/2021 3:34 AM

## 2021-01-11 NOTE — Progress Notes (Addendum)
Progress Note  Patient Name: Sheri Vargas Date of Encounter: 01/11/2021  [facility] Cardiologist: None New  Subjective   Still with 4/10 ongoing chest discomfort.   Inpatient Medications    Scheduled Meds:  aspirin  81 mg Oral Pre-Cath   aspirin EC  81 mg Oral Daily   levothyroxine  50 mcg Oral Q0600   metoprolol tartrate  12.5 mg Oral Q6H   nicotine  21 mg Transdermal Once   potassium chloride  40 mEq Oral Q6H   [START ON 01/12/2021] rosuvastatin  20 mg Oral Daily   sertraline  25 mg Oral Daily   Continuous Infusions:  sodium chloride     Followed by   sodium chloride     heparin 1,250 Units/hr (01/11/21 0507)   PRN Meds: LORazepam, nicotine polacrilex   Vital Signs    Vitals:   01/11/21 0023 01/11/21 0025 01/11/21 0155 01/11/21 0554  BP: 110/67 110/67 123/80 101/73  Pulse: 86 93 89 65  Resp: 16 14 14 15   Temp:  98.2 F (36.8 C) 98.2 F (36.8 C) 98.2 F (36.8 C)  TempSrc:  Oral Oral Oral  SpO2: 97% 100% 95% 93%  Weight:   81.8 kg   Height:   5\' 3"  (1.6 m)    No intake or output data in the 24 hours ending 01/11/21 0719 Last 3 Weights 01/11/2021 01/07/2021 12/24/2020  Weight (lbs) 180 lb 4.8 oz 186 lb 8 oz 193 lb  Weight (kg) 81.784 kg 84.596 kg 87.544 kg      Telemetry    SR - Personally Reviewed  ECG    SR - Personally Reviewed  Physical Exam   GEN: No acute distress.   Neck: No JVD Cardiac: RRR, no murmurs, rubs, or gallops.  Respiratory: Clear to auscultation bilaterally. GI: Soft, nontender, non-distended  MS: No edema; No deformity. Neuro:  Nonfocal  Psych: Normal affect   Labs    High Sensitivity Troponin:   Recent Labs  Lab 01/10/21 1755 01/10/21 1949 01/11/21 0342  TROPONINIHS 909* 1,196* 1,161*     Chemistry Recent Labs  Lab 01/10/21 1755 01/11/21 0342  NA 137 138  K 2.9* 3.4*  CL 103 107  CO2 20* 23  GLUCOSE 117* 87  BUN 11 5*  CREATININE 0.56 0.70  CALCIUM 8.8* 9.2  MG  --  2.0  PROT  --  6.8   ALBUMIN  --  3.9  AST  --  26  ALT  --  19  ALKPHOS  --  45  BILITOT  --  0.6  GFRNONAA >60 >60  ANIONGAP 14 8    Lipids  Recent Labs  Lab 01/11/21 0342  CHOL 183  TRIG 71  HDL 49  LDLCALC 120*  CHOLHDL 3.7    Hematology Recent Labs  Lab 01/10/21 1755  WBC 11.7*  RBC 4.34  HGB 13.3  HCT 40.4  MCV 93.1  MCH 30.6  MCHC 32.9  RDW 13.9  PLT 482*   Thyroid  Recent Labs  Lab 01/11/21 0342  TSH 0.675    BNPNo results for input(s): BNP, PROBNP in the last 168 hours.  DDimer No results for input(s): DDIMER in the last 168 hours.   Radiology    DG Chest 2 View  Result Date: 01/10/2021 CLINICAL DATA:  chest pain EXAM: CHEST - 2 VIEW COMPARISON:  Chest x-ray 09/07/2012 FINDINGS: The heart and mediastinal contours are unchanged. Aortic calcification. No focal consolidation. No pulmonary edema. No pleural effusion. No  pneumothorax. No acute osseous abnormality. IMPRESSION: No active cardiopulmonary disease. Electronically Signed   By: Tish Frederickson M.D.   On: 01/10/2021 17:50   CT Angio Chest PE W and/or Wo Contrast  Result Date: 01/10/2021 CLINICAL DATA:  Tachycardia EXAM: CT ANGIOGRAPHY CHEST WITH CONTRAST TECHNIQUE: Multidetector CT imaging of the chest was performed using the standard protocol during bolus administration of intravenous contrast. Multiplanar CT image reconstructions and MIPs were obtained to evaluate the vascular anatomy. CONTRAST:  68mL OMNIPAQUE IOHEXOL 350 MG/ML SOLN COMPARISON:  None. FINDINGS: Cardiovascular: Adequate opacification of the pulmonary arterial tree. No intraluminal filling defect identified to suggest acute pulmonary embolism. Central pulmonary arteries are of normal caliber. No significant coronary artery calcification. Cardiac size within normal limits. No pericardial effusion. Mild atherosclerotic calcification within the thoracic aorta. No aortic aneurysm. Mediastinum/Nodes: No enlarged mediastinal, hilar, or axillary lymph nodes.  Thyroid gland, trachea, and esophagus demonstrate no significant findings. Lungs/Pleura: There is patchy ground-glass pulmonary infiltrate within the perihilar left upper lobe which may be infectious or inflammatory in the acute setting. Minimal infiltrate is also noted within the right upper lobe. The lungs are otherwise clear. No pneumothorax or pleural effusion. Central airways are widely patent. Upper Abdomen: Cholecystectomy has been performed. No acute abnormality. Musculoskeletal: No acute bone abnormality. No lytic or blastic bone lesion. Review of the MIP images confirms the above findings. IMPRESSION: No pulmonary embolism. Multifocal pulmonary infiltrates, most severe within the left upper lobe, likely infectious or inflammatory in the acute setting. Aortic Atherosclerosis (ICD10-I70.0). Electronically Signed   By: Helyn Numbers M.D.   On: 01/10/2021 20:16    Cardiac Studies   N/a   Patient Profile     45 y.o. female with obesity, tobacco use, hypothyroidism who presented with chest pain and found to have a NSTEMI.   Assessment & Plan    NSTEMI: hsTn up to 1196. Still with ongoing 4/10 chest discomfort. Will plan for cardiac cath today.  -- continue IV heparin, ASA, statin, low dose metoprolol  -- add nitropaste -- echo pending  Shared Decision Making/Informed Consent{ The risks [stroke (1 in 1000), death (1 in 1000), kidney failure [usually temporary] (1 in 500), bleeding (1 in 200), allergic reaction [possibly serious] (1 in 200)], benefits (diagnostic support and management of coronary artery disease) and alternatives of a cardiac catheterization were discussed in detail with Sheri Vargas and she is willing to proceed.  Tobacco use: cessation advised  HLD: LDL 120 -- now on high dose statin  Anxiety:  -- Home sertraline 25 mg daily -- Ativan 1 mg q12h po  Hypothyroidism: TSH 0.675 -- continue synthroid  For questions or updates, please contact [facility] Please  consult [email or web address] for contact info under    Signed, Laverda Page, NP  01/11/2021, 7:19 AM    Agree with note by Laverda Page NP-C  Patient with positive risk factors admitted with non-STEMI.  Her troponins are 1196.  She is on IV heparin and nitroglycerin.  Her EKG shows no acute changes.  Her exam is benign.  The problems include tobacco abuse and hyperlipidemia as well as anxiety.  She is scheduled for diagnostic cardiac catheterization this morning.  Runell Gess, M.D., FACP, [facility], [facility] [address] [place], [place]  [address]  [phone] 01/11/2021 9:35 AM    Addendum: Results of cardiac catheterization noted.  Her coronary arteries were essentially clean.  She did have regional wall motion abnormalities consistent with either regional  myocarditis or a "Takotsubo variant".  She still having some vague chest pain.  Troponins were positive.  She will be treated with a beta-blocker and an ARB.  Anticipate discharge home tomorrow.  Runell Gess, M.D., FACP, [facility], [facility] [address] [place], [place]  [address]  [phone] 01/11/2021 12:05 PM

## 2021-01-12 ENCOUNTER — Other Ambulatory Visit: Payer: Self-pay | Admitting: Physician Assistant

## 2021-01-12 DIAGNOSIS — I214 Non-ST elevation (NSTEMI) myocardial infarction: Secondary | ICD-10-CM | POA: Diagnosis not present

## 2021-01-12 LAB — CBC
HCT: 37 % (ref 36.0–46.0)
Hemoglobin: 12.5 g/dL (ref 12.0–15.0)
MCH: 31.3 pg (ref 26.0–34.0)
MCHC: 33.8 g/dL (ref 30.0–36.0)
MCV: 92.7 fL (ref 80.0–100.0)
Platelets: 382 10*3/uL (ref 150–400)
RBC: 3.99 MIL/uL (ref 3.87–5.11)
RDW: 14 % (ref 11.5–15.5)
WBC: 8.6 10*3/uL (ref 4.0–10.5)
nRBC: 0 % (ref 0.0–0.2)

## 2021-01-12 MED ORDER — POTASSIUM CHLORIDE CRYS ER 20 MEQ PO TBCR
40.0000 meq | EXTENDED_RELEASE_TABLET | Freq: Once | ORAL | Status: AC
  Administered 2021-01-12: 13:00:00 40 meq via ORAL
  Filled 2021-01-12: qty 2

## 2021-01-12 MED ORDER — ASPIRIN 81 MG PO TBEC: 81.0000 mg | DELAYED_RELEASE_TABLET | Freq: Every day | ORAL | 11 refills | Status: AC

## 2021-01-12 MED ORDER — METOPROLOL SUCCINATE ER 50 MG PO TB24: 50.0000 mg | ORAL_TABLET | Freq: Every day | ORAL | 11 refills | Status: DC

## 2021-01-12 MED ORDER — NICOTINE POLACRILEX 2 MG MT GUM: 2.0000 mg | CHEWING_GUM | OROMUCOSAL | 0 refills | Status: AC | PRN

## 2021-01-12 MED ORDER — NITROGLYCERIN 0.4 MG SL SUBL: 0.4000 mg | SUBLINGUAL_TABLET | SUBLINGUAL | 3 refills | Status: AC | PRN

## 2021-01-12 MED ORDER — ROSUVASTATIN CALCIUM 20 MG PO TABS: 20.0000 mg | ORAL_TABLET | Freq: Every day | ORAL | 6 refills | Status: DC

## 2021-01-12 NOTE — Progress Notes (Signed)
Progress Note  Patient Name: Sheri Vargas Date of Encounter: 01/12/2021  [facility] Cardiologist: None New  Subjective   Currently feeling well.  No further chest pain.  Inpatient Medications    Scheduled Meds:  aspirin EC  81 mg Oral Daily   enoxaparin (LOVENOX) injection  40 mg Subcutaneous Q24H   levothyroxine  50 mcg Oral Q0600   losartan  12.5 mg Oral Daily   metoprolol tartrate  12.5 mg Oral BID   rosuvastatin  20 mg Oral Daily   sertraline  25 mg Oral Daily   sodium chloride flush  3 mL Intravenous Q12H   Continuous Infusions:  sodium chloride     PRN Meds: sodium chloride, acetaminophen, LORazepam, nicotine polacrilex, ondansetron (ZOFRAN) IV, sodium chloride flush   Vital Signs    Vitals:   01/12/21 0033 01/12/21 0537 01/12/21 0703 01/12/21 0817  BP: 98/80 92/67 (!) 86/58 106/67  Pulse: 64  62 77  Resp: 18 16 16    Temp: 98.4 F (36.9 C) 98 F (36.7 C) 98.4 F (36.9 C)   TempSrc: Oral Oral Oral   SpO2: 95% 96% 97%   Weight:      Height:        Intake/Output Summary (Last 24 hours) at 01/12/2021 1051 Last data filed at 01/11/2021 2030 Gross per 24 hour  Intake 240 ml  Output --  Net 240 ml   Last 3 Weights 01/11/2021 01/07/2021 12/24/2020  Weight (lbs) 180 lb 4.8 oz 186 lb 8 oz 193 lb  Weight (kg) 81.784 kg 84.596 kg 87.544 kg      Telemetry    Sinus rhythm-personally reviewed  ECG    No new  Physical Exam   GEN: Well nourished, well developed, in no acute distress  HEENT: normal  Neck: no JVD, carotid bruits, or masses Cardiac: RRR; no murmurs, rubs, or gallops,no edema  Respiratory:  clear to auscultation bilaterally, normal work of breathing GI: soft, nontender, nondistended, + BS MS: no deformity or atrophy  Skin: warm and dry Neuro:  Strength and sensation are intact Psych: euthymic mood, full affect    Labs    High Sensitivity Troponin:   Recent Labs  Lab 01/10/21 1755 01/10/21 1949 01/11/21 0342   TROPONINIHS 909* 1,196* 1,161*      Chemistry Recent Labs  Lab 01/10/21 1755 01/11/21 0342  NA 137 138  K 2.9* 3.4*  CL 103 107  CO2 20* 23  GLUCOSE 117* 87  BUN 11 5*  CREATININE 0.56 0.70  CALCIUM 8.8* 9.2  MG  --  2.0  PROT  --  6.8  ALBUMIN  --  3.9  AST  --  26  ALT  --  19  ALKPHOS  --  45  BILITOT  --  0.6  GFRNONAA >60 >60  ANIONGAP 14 8     Lipids  Recent Labs  Lab 01/11/21 0342  CHOL 183  TRIG 71  HDL 49  LDLCALC 120*  CHOLHDL 3.7     Hematology Recent Labs  Lab 01/10/21 1755 01/11/21 0342 01/12/21 0603  WBC 11.7* 11.0* 8.6  RBC 4.34 4.32 3.99  HGB 13.3 13.5 12.5  HCT 40.4 39.5 37.0  MCV 93.1 91.4 92.7  MCH 30.6 31.3 31.3  MCHC 32.9 34.2 33.8  RDW 13.9 13.9 14.0  PLT 482* 443* 382    Thyroid  Recent Labs  Lab 01/11/21 0342  TSH 0.675     BNPNo results for input(s): BNP, PROBNP in the last  168 hours.  DDimer No results for input(s): DDIMER in the last 168 hours.   Radiology    DG Chest 2 View  Result Date: 01/10/2021 CLINICAL DATA:  chest pain EXAM: CHEST - 2 VIEW COMPARISON:  Chest x-ray 09/07/2012 FINDINGS: The heart and mediastinal contours are unchanged. Aortic calcification. No focal consolidation. No pulmonary edema. No pleural effusion. No pneumothorax. No acute osseous abnormality. IMPRESSION: No active cardiopulmonary disease. Electronically Signed   By: Tish Frederickson M.D.   On: 01/10/2021 17:50   CT Angio Chest PE W and/or Wo Contrast  Result Date: 01/10/2021 CLINICAL DATA:  Tachycardia EXAM: CT ANGIOGRAPHY CHEST WITH CONTRAST TECHNIQUE: Multidetector CT imaging of the chest was performed using the standard protocol during bolus administration of intravenous contrast. Multiplanar CT image reconstructions and MIPs were obtained to evaluate the vascular anatomy. CONTRAST:  75mL OMNIPAQUE IOHEXOL 350 MG/ML SOLN COMPARISON:  None. FINDINGS: Cardiovascular: Adequate opacification of the pulmonary arterial tree. No  intraluminal filling defect identified to suggest acute pulmonary embolism. Central pulmonary arteries are of normal caliber. No significant coronary artery calcification. Cardiac size within normal limits. No pericardial effusion. Mild atherosclerotic calcification within the thoracic aorta. No aortic aneurysm. Mediastinum/Nodes: No enlarged mediastinal, hilar, or axillary lymph nodes. Thyroid gland, trachea, and esophagus demonstrate no significant findings. Lungs/Pleura: There is patchy ground-glass pulmonary infiltrate within the perihilar left upper lobe which may be infectious or inflammatory in the acute setting. Minimal infiltrate is also noted within the right upper lobe. The lungs are otherwise clear. No pneumothorax or pleural effusion. Central airways are widely patent. Upper Abdomen: Cholecystectomy has been performed. No acute abnormality. Musculoskeletal: No acute bone abnormality. No lytic or blastic bone lesion. Review of the MIP images confirms the above findings. IMPRESSION: No pulmonary embolism. Multifocal pulmonary infiltrates, most severe within the left upper lobe, likely infectious or inflammatory in the acute setting. Aortic Atherosclerosis (ICD10-I70.0). Electronically Signed   By: Helyn Numbers M.D.   On: 01/10/2021 20:16   CARDIAC CATHETERIZATION  Result Date: 01/11/2021 CONCLUSIONS: Minimal plaquing mid LAD and first diagonal.  Otherwise coronaries are widely patent and demonstrate a right dominant anatomy. Anterobasal and mid anterior wall akinesis with elevated LVEDP consistent with acute diastolic heart failure.  EF 50 to 55%.  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 RECOMMENDATIONS: Encourage smoking cessation Beta-blocker therapy/RAAS inhibition Watch for evidence of heart failure   ECHOCARDIOGRAM COMPLETE  Result Date: 01/11/2021    ECHOCARDIOGRAM REPORT   Patient Name:   Sheri Vargas Date of Exam: 01/11/2021  Medical Rec #:  [id number]         Height:       63.0 in Accession #:    [id number]        Weight:       180.3 lb Date of Birth:  October 02, 1975        BSA:          1.850 m Patient Age:    45 years          BP:           102/68 mmHg Patient Gender: F                 HR:           71 bpm. Exam Location:  Inpatient Procedure: 2D Echo Indications:    acute myocardial infarction  History:        Patient has no prior history of Echocardiogram examinations.  Risk Factors:Current Smoker.  Sonographer:    Delcie Roch RDCS Referring Phys: [id number] CARRIEL T NIPP IMPRESSIONS  1. Left ventricular ejection fraction, by estimation, is 60 to 65%. The left ventricle has normal function. The left ventricle has no regional wall motion abnormalities. Left ventricular diastolic parameters were normal. There is mild hypokinesis of the  left ventricular, mid anteroseptal wall, anterior wall and inferoseptal wall.  2. Right ventricular systolic function is normal. The right ventricular size is normal. There is normal pulmonary artery systolic pressure. The estimated right ventricular systolic pressure is 19.3 mmHg.  3. The mitral valve is normal in structure. No evidence of mitral valve regurgitation. No evidence of mitral stenosis.  4. The aortic valve is normal in structure. Aortic valve regurgitation is not visualized. No aortic stenosis is present.  5. The inferior vena cava is normal in size with greater than 50% respiratory variability, suggesting right atrial pressure of 3 mmHg. Conclusion(s)/Recommendation(s): Atypical distribution of wall motion abnormalities, does not respect typical coronary distribution. Consider stress cardiomyopathy (takotsubo sd.). FINDINGS  Left Ventricle: Left ventricular ejection fraction, by estimation, is 60 to 65%. The left ventricle has normal function. The left ventricle has no regional wall motion abnormalities. Mild hypokinesis of the left ventricular, mid anteroseptal wall,  anterior wall and inferoseptal wall. The left ventricular internal cavity size was normal in size. There is no left ventricular hypertrophy. Left ventricular diastolic parameters were normal. Normal left ventricular filling pressure.  LV Wall Scoring: The mid anteroseptal segment, mid inferoseptal segment, and mid anterior segment are hypokinetic. Right Ventricle: The right ventricular size is normal. No increase in right ventricular wall thickness. Right ventricular systolic function is normal. There is normal pulmonary artery systolic pressure. The tricuspid regurgitant velocity is 2.02 m/s, and  with an assumed right atrial pressure of 3 mmHg, the estimated right ventricular systolic pressure is 19.3 mmHg. Left Atrium: Left atrial size was normal in size. Right Atrium: Right atrial size was normal in size. Pericardium: There is no evidence of pericardial effusion. Mitral Valve: The mitral valve is normal in structure. No evidence of mitral valve regurgitation. No evidence of mitral valve stenosis. Tricuspid Valve: The tricuspid valve is normal in structure. Tricuspid valve regurgitation is mild . No evidence of tricuspid stenosis. Aortic Valve: The aortic valve is normal in structure. Aortic valve regurgitation is not visualized. No aortic stenosis is present. Pulmonic Valve: The pulmonic valve was normal in structure. Pulmonic valve regurgitation is not visualized. No evidence of pulmonic stenosis. Aorta: The aortic root is normal in size and structure. Venous: The inferior vena cava is normal in size with greater than 50% respiratory variability, suggesting right atrial pressure of 3 mmHg. IAS/Shunts: No atrial level shunt detected by color flow Doppler.  LEFT VENTRICLE PLAX 2D LVIDd:         4.60 cm     Diastology LVIDs:         3.20 cm     LV e' medial:    8.49 cm/s LV PW:         0.90 cm     LV E/e' medial:  10.0 LV IVS:        0.80 cm     LV e' lateral:   10.40 cm/s LVOT diam:     1.80 cm     LV E/e'  lateral: 8.1 LV SV:         47 LV SV Index:   25 LVOT Area:     2.54 cm  LV Volumes (MOD) LV vol d, MOD A2C: 71.4 ml LV vol s, MOD A2C: 35.5 ml LV SV MOD A2C:     35.9 ml RIGHT VENTRICLE             IVC RV S prime:     14.70 cm/s  IVC diam: 1.40 cm TAPSE (M-mode): 2.2 cm LEFT ATRIUM             Index        RIGHT ATRIUM           Index LA diam:        3.30 cm 1.78 cm/m   RA Area:     15.40 cm LA Vol (A2C):   53.5 ml 28.91 ml/m  RA Volume:   39.20 ml  21.19 ml/m LA Vol (A4C):   45.2 ml 24.43 ml/m LA Biplane Vol: 50.9 ml 27.51 ml/m  AORTIC VALVE LVOT Vmax:   89.00 cm/s LVOT Vmean:  61.300 cm/s LVOT VTI:    0.185 m  AORTA Ao Root diam: 2.90 cm Ao Asc diam:  2.90 cm MITRAL VALVE               TRICUSPID VALVE MV Area (PHT): 4.15 cm    TR Peak grad:   16.3 mmHg MV Decel Time: 183 msec    TR Vmax:        202.00 cm/s MV E velocity: 84.60 cm/s MV A velocity: 71.70 cm/s  SHUNTS MV E/A ratio:  1.18        Systemic VTI:  0.18 m                            Systemic Diam: 1.80 cm Mihai Croitoru MD Electronically signed by Thurmon Fair MD Signature Date/Time: 01/11/2021/3:31:02 PM    Final     Cardiac Studies   Left heart catheterization Minimal plaquing mid LAD and first diagonal.  Otherwise coronaries are widely patent and demonstrate a right dominant anatomy. Anterobasal and mid anterior wall akinesis with elevated LVEDP consistent with acute diastolic heart failure.  EF 50 to 55%.  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   RECOMMENDATIONS:   Encourage smoking cessation Beta-blocker therapy/RAAS inhibition Watch for evidence of heart failure  TTE   1. Left ventricular ejection fraction, by estimation, is 60 to 65%. The  left ventricle has normal function. The left ventricle has no regional  wall motion abnormalities. Left ventricular diastolic parameters were  normal. There is mild hypokinesis of the   left ventricular, mid anteroseptal wall,  anterior wall and inferoseptal  wall.   2. Right ventricular systolic function is normal. The right ventricular  size is normal. There is normal pulmonary artery systolic pressure. The  estimated right ventricular systolic pressure is 19.3 mmHg.   3. The mitral valve is normal in structure. No evidence of mitral valve  regurgitation. No evidence of mitral stenosis.   4. The aortic valve is normal in structure. Aortic valve regurgitation is  not visualized. No aortic stenosis is present.   5. The inferior vena cava is normal in size with greater than 50%  respiratory variability, suggesting right atrial pressure of 3 mmHg.   Patient Profile     45 y.o. female with obesity, tobacco use, hypothyroidism who presented with chest pain and found to have a NSTEMI.   Assessment & Plan    1.  Non-STEMI: Troponin elevated to 1096.  Left  heart catheterization yesterday with normal coronary arteries.  Does have anterobasal akinesis/hypokinesis potentially consistent with Takotsubo versus myocarditis.  We Reshunda Strider arrange for outpatient cardiac MRI.  We Kaisyn Millea have her follow-up with general cardiology.  Discharged on Toprol-XL 50 mg.  Rushie Brazel need follow-up with Dr. Gery Pray post cardiac MRI.  2.  Tobacco abuse: Complete cessation encouraged  3.  Hyperlipidemia: Goal LDL less than 70.  Continue high-dose statin.  4.  Anxiety: Continue home sertraline and Ativan.  5.  Hypothyroidism: Continue Synthroid.   For questions or updates, please contact [facility] Please consult [email or web address] for contact info under    Signed, Mirabella Hilario Jorja Loa, MD  01/12/2021, 10:51 AM    01/12/2021 10:51 AM

## 2021-01-12 NOTE — Discharge Instructions (Signed)

## 2021-01-12 NOTE — Discharge Summary (Addendum)
Discharge Summary    Patient ID: Sheri Vargas MRN: [id number]; DOB: February 20, 1975  Admit date: 01/10/2021 Discharge date: 01/12/2021  PCP:  Benita Stabile, MD   [facility] Providers Cardiologist:  Nanetta Batty, MD        Discharge Diagnoses    Principal Problem:   NSTEMI (non-ST elevated myocardial infarction) [facility]) Active Problems:   Anxiety and depression    Diagnostic Studies/Procedures    Left heart catheterization 12/12/2020 Minimal plaquing mid LAD and first diagonal.  Otherwise coronaries are widely patent and demonstrate a right dominant anatomy. Anterobasal and mid anterior wall akinesis with elevated LVEDP consistent with acute diastolic heart failure.  EF 50 to 55%.  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   RECOMMENDATIONS:   Encourage smoking cessation Beta-blocker therapy/RAAS inhibition Watch for evidence of heart failure   TTE 12/12/2020  1. Left ventricular ejection fraction, by estimation, is 60 to 65%. The  left ventricle has normal function. The left ventricle has no regional  wall motion abnormalities. Left ventricular diastolic parameters were  normal. There is mild hypokinesis of the   left ventricular, mid anteroseptal wall, anterior wall and inferoseptal  wall.   2. Right ventricular systolic function is normal. The right ventricular  size is normal. There is normal pulmonary artery systolic pressure. The  estimated right ventricular systolic pressure is 19.3 mmHg.   3. The mitral valve is normal in structure. No evidence of mitral valve  regurgitation. No evidence of mitral stenosis.   4. The aortic valve is normal in structure. Aortic valve regurgitation is  not visualized. No aortic stenosis is present.   5. The inferior vena cava is normal in size with greater than 50%  respiratory variability, suggesting right atrial pressure of 3 mmHg.  _____________   History of Present  Illness     Sheri Vargas is a 45 y.o. female with a history of obesity, tobacco use, hypothyroidism, who was admitted 12/23 with chest pain, NSTEMI. n Hospital Course     Consultants: None  Her peak troponin was 1196.  Cardiac catheterization was performed which showed normal coronary arteries.  She had a wall motion abnormality with anterobasal akinesis/hypokinesis.  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  Corban Kistler need MRI as an outpatient.  She was started on high-dose statin for goal LDL less than 70.  She was started on Toprol-XL as well.  Smoking cessation was encouraged.  She was hypokalemic on admission and was supplemented.  Her TSH was normal and she was continued on her home dose of Synthroid.  On 12/24, she was seen by Dr. Elberta Fortis and all data were reviewed.  No further inpatient work-up is indicated and she is considered stable for discharge, to follow-up as an outpatient.  The cardiac MRI has been ordered and a message has been sent to the schedulers to make sure she has a follow-up appointment.  Did the patient have an acute coronary syndrome (MI, NSTEMI, STEMI, etc) this admission?:  Yes                               AHA/ACC Clinical Performance & Quality Measures: Aspirin prescribed? - Yes ADP Receptor Inhibitor (Plavix/Clopidogrel, Brilinta/Ticagrelor or Effient/Prasugrel) prescribed (includes medically managed patients)? - Yes Beta Blocker prescribed? - Yes High Intensity Statin (Lipitor 40-80mg  or Crestor 20-40mg ) prescribed? - Yes EF assessed during THIS hospitalization? - Yes For EF <40%, was ACEI/ARB prescribed? -  Not Applicable (EF >/= 40%) For EF <40%, Aldosterone Antagonist (Spironolactone or Eplerenone) prescribed? - Not Applicable (EF >/= 40%) Cardiac Rehab Phase II ordered (including medically managed patients)? - Yes    _____________  Discharge Vitals Blood pressure 106/67, pulse 77, temperature 98.4 F (36.9 C), temperature source Oral, resp.  rate 16, height 5\' 3"  (1.6 m), weight 81.8 kg, last menstrual period 01/05/2021, SpO2 97 %.  Filed Weights   01/11/21 0155  Weight: 81.8 kg    Labs & Radiologic Studies    CBC Recent Labs    01/11/21 0342 01/12/21 0603  WBC 11.0* 8.6  HGB 13.5 12.5  HCT 39.5 37.0  MCV 91.4 92.7  PLT 443* 382   Basic Metabolic Panel Recent Labs    01/14/21 1755 01/11/21 0342  NA 137 138  K 2.9* 3.4*  CL 103 107  CO2 20* 23  GLUCOSE 117* 87  BUN 11 5*  CREATININE 0.56 0.70  CALCIUM 8.8* 9.2  MG  --  2.0   Liver Function Tests Recent Labs    01/11/21 0342  AST 26  ALT 19  ALKPHOS 45  BILITOT 0.6  PROT 6.8  ALBUMIN 3.9   No results for input(s): LIPASE, AMYLASE in the last 72 hours. High Sensitivity Troponin:   Recent Labs  Lab 01/10/21 1755 01/10/21 1949 01/11/21 0342  TROPONINIHS 909* 1,196* 1,161*    BNP Invalid input(s): POCBNP D-Dimer No results for input(s): DDIMER in the last 72 hours. Hemoglobin A1C Recent Labs    01/11/21 0342  HGBA1C 5.4   Fasting Lipid Panel Recent Labs    01/11/21 0342  CHOL 183  HDL 49  LDLCALC 120*  TRIG 71  CHOLHDL 3.7   Thyroid Function Tests Recent Labs    01/11/21 0342  TSH 0.675   _____________  DG Chest 2 View  Result Date: 01/10/2021 CLINICAL DATA:  chest pain EXAM: CHEST - 2 VIEW COMPARISON:  Chest x-ray 09/07/2012 FINDINGS: The heart and mediastinal contours are unchanged. Aortic calcification. No focal consolidation. No pulmonary edema. No pleural effusion. No pneumothorax. No acute osseous abnormality. IMPRESSION: No active cardiopulmonary disease. Electronically Signed   By: 09/09/2012 M.D.   On: 01/10/2021 17:50   CT Angio Chest PE W and/or Wo Contrast  Result Date: 01/10/2021 CLINICAL DATA:  Tachycardia EXAM: CT ANGIOGRAPHY CHEST WITH CONTRAST TECHNIQUE: Multidetector CT imaging of the chest was performed using the standard protocol during bolus administration of intravenous contrast. Multiplanar CT  image reconstructions and MIPs were obtained to evaluate the vascular anatomy. CONTRAST:  13mL OMNIPAQUE IOHEXOL 350 MG/ML SOLN COMPARISON:  None. FINDINGS: Cardiovascular: Adequate opacification of the pulmonary arterial tree. No intraluminal filling defect identified to suggest acute pulmonary embolism. Central pulmonary arteries are of normal caliber. No significant coronary artery calcification. Cardiac size within normal limits. No pericardial effusion. Mild atherosclerotic calcification within the thoracic aorta. No aortic aneurysm. Mediastinum/Nodes: No enlarged mediastinal, hilar, or axillary lymph nodes. Thyroid gland, trachea, and esophagus demonstrate no significant findings. Lungs/Pleura: There is patchy ground-glass pulmonary infiltrate within the perihilar left upper lobe which may be infectious or inflammatory in the acute setting. Minimal infiltrate is also noted within the right upper lobe. The lungs are otherwise clear. No pneumothorax or pleural effusion. Central airways are widely patent. Upper Abdomen: Cholecystectomy has been performed. No acute abnormality. Musculoskeletal: No acute bone abnormality. No lytic or blastic bone lesion. Review of the MIP images confirms the above findings. IMPRESSION: No pulmonary embolism. Multifocal pulmonary infiltrates, most severe  within the left upper lobe, likely infectious or inflammatory in the acute setting. Aortic Atherosclerosis (ICD10-I70.0). Electronically Signed   By: Helyn Numbers M.D.   On: 01/10/2021 20:16   CARDIAC CATHETERIZATION  Result Date: 01/11/2021 CONCLUSIONS: Minimal plaquing mid LAD and first diagonal.  Otherwise coronaries are widely patent and demonstrate a right dominant anatomy. Anterobasal and mid anterior wall akinesis with elevated LVEDP consistent with acute diastolic heart failure.  EF 50 to 55%.  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 RECOMMENDATIONS: Encourage smoking  cessation Beta-blocker therapy/RAAS inhibition Watch for evidence of heart failure   ECHOCARDIOGRAM COMPLETE  Result Date: 01/11/2021    ECHOCARDIOGRAM REPORT   Patient Name:   COREE RIESTER Date of Exam: 01/11/2021 Medical Rec #:  [id number]         Height:       63.0 in Accession #:    [id number]        Weight:       180.3 lb Date of Birth:  1975/10/22        BSA:          1.850 m Patient Age:    45 years          BP:           102/68 mmHg Patient Gender: F                 HR:           71 bpm. Exam Location:  Inpatient Procedure: 2D Echo Indications:    acute myocardial infarction  History:        Patient has no prior history of Echocardiogram examinations.                 Risk Factors:Current Smoker.  Sonographer:    Delcie Roch RDCS Referring Phys: [id number] CARRIEL T NIPP IMPRESSIONS  1. Left ventricular ejection fraction, by estimation, is 60 to 65%. The left ventricle has normal function. The left ventricle has no regional wall motion abnormalities. Left ventricular diastolic parameters were normal. There is mild hypokinesis of the  left ventricular, mid anteroseptal wall, anterior wall and inferoseptal wall.  2. Right ventricular systolic function is normal. The right ventricular size is normal. There is normal pulmonary artery systolic pressure. The estimated right ventricular systolic pressure is 19.3 mmHg.  3. The mitral valve is normal in structure. No evidence of mitral valve regurgitation. No evidence of mitral stenosis.  4. The aortic valve is normal in structure. Aortic valve regurgitation is not visualized. No aortic stenosis is present.  5. The inferior vena cava is normal in size with greater than 50% respiratory variability, suggesting right atrial pressure of 3 mmHg. Conclusion(s)/Recommendation(s): Atypical distribution of wall motion abnormalities, does not respect typical coronary distribution. Consider stress cardiomyopathy (takotsubo sd.). FINDINGS  Left Ventricle: Left  ventricular ejection fraction, by estimation, is 60 to 65%. The left ventricle has normal function. The left ventricle has no regional wall motion abnormalities. Mild hypokinesis of the left ventricular, mid anteroseptal wall, anterior wall and inferoseptal wall. The left ventricular internal cavity size was normal in size. There is no left ventricular hypertrophy. Left ventricular diastolic parameters were normal. Normal left ventricular filling pressure.  LV Wall Scoring: The mid anteroseptal segment, mid inferoseptal segment, and mid anterior segment are hypokinetic. Right Ventricle: The right ventricular size is normal. No increase in right ventricular wall thickness. Right ventricular systolic function is normal. There is normal pulmonary artery systolic  pressure. The tricuspid regurgitant velocity is 2.02 m/s, and  with an assumed right atrial pressure of 3 mmHg, the estimated right ventricular systolic pressure is 19.3 mmHg. Left Atrium: Left atrial size was normal in size. Right Atrium: Right atrial size was normal in size. Pericardium: There is no evidence of pericardial effusion. Mitral Valve: The mitral valve is normal in structure. No evidence of mitral valve regurgitation. No evidence of mitral valve stenosis. Tricuspid Valve: The tricuspid valve is normal in structure. Tricuspid valve regurgitation is mild . No evidence of tricuspid stenosis. Aortic Valve: The aortic valve is normal in structure. Aortic valve regurgitation is not visualized. No aortic stenosis is present. Pulmonic Valve: The pulmonic valve was normal in structure. Pulmonic valve regurgitation is not visualized. No evidence of pulmonic stenosis. Aorta: The aortic root is normal in size and structure. Venous: The inferior vena cava is normal in size with greater than 50% respiratory variability, suggesting right atrial pressure of 3 mmHg. IAS/Shunts: No atrial level shunt detected by color flow Doppler.  LEFT VENTRICLE PLAX 2D LVIDd:          4.60 cm     Diastology LVIDs:         3.20 cm     LV e' medial:    8.49 cm/s LV PW:         0.90 cm     LV E/e' medial:  10.0 LV IVS:        0.80 cm     LV e' lateral:   10.40 cm/s LVOT diam:     1.80 cm     LV E/e' lateral: 8.1 LV SV:         47 LV SV Index:   25 LVOT Area:     2.54 cm  LV Volumes (MOD) LV vol d, MOD A2C: 71.4 ml LV vol s, MOD A2C: 35.5 ml LV SV MOD A2C:     35.9 ml RIGHT VENTRICLE             IVC RV S prime:     14.70 cm/s  IVC diam: 1.40 cm TAPSE (M-mode): 2.2 cm LEFT ATRIUM             Index        RIGHT ATRIUM           Index LA diam:        3.30 cm 1.78 cm/m   RA Area:     15.40 cm LA Vol (A2C):   53.5 ml 28.91 ml/m  RA Volume:   39.20 ml  21.19 ml/m LA Vol (A4C):   45.2 ml 24.43 ml/m LA Biplane Vol: 50.9 ml 27.51 ml/m  AORTIC VALVE LVOT Vmax:   89.00 cm/s LVOT Vmean:  61.300 cm/s LVOT VTI:    0.185 m  AORTA Ao Root diam: 2.90 cm Ao Asc diam:  2.90 cm MITRAL VALVE               TRICUSPID VALVE MV Area (PHT): 4.15 cm    TR Peak grad:   16.3 mmHg MV Decel Time: 183 msec    TR Vmax:        202.00 cm/s MV E velocity: 84.60 cm/s MV A velocity: 71.70 cm/s  SHUNTS MV E/A ratio:  1.18        Systemic VTI:  0.18 m  Systemic Diam: 1.80 cm Thurmon Fair MD Electronically signed by Thurmon Fair MD Signature Date/Time: 01/11/2021/3:31:02 PM    Final    Disposition   Pt is being discharged home today in good condition.  Follow-up Plans & Appointments     Follow-up Information     Runell Gess, MD Follow up.   Specialties: Cardiology, Radiology Why: The office Soni Kegel call with a follow-up appointment with Dr. Allyson Sabal. The MRI should be scheduled by hospital staff and you should get a call about that.  Make sure the follow-up appointment with Dr. Allyson Sabal is after the MRI. Contact information: [address] [place] [address] [phone]                Discharge Instructions     Diet - low sodium heart healthy   Complete  by: As directed    Increase activity slowly   Complete by: As directed        Discharge Medications   Allergies as of 01/12/2021       Reactions   Latex         Medication List     STOP taking these medications    atorvastatin 20 MG tablet Commonly known as: LIPITOR       TAKE these medications    ALPRAZolam 0.5 MG tablet Commonly known as: XANAX Take 0.5 mg by mouth 2 (two) times daily as needed for anxiety.   aspirin 81 MG EC tablet Take 1 tablet (81 mg total) by mouth daily. Swallow whole. Start taking on: January 13, 2021   GOODYS BODY PAIN PO Take 1 packet by mouth 2 (two) times daily as needed (headache pain).   levothyroxine 50 MCG tablet Commonly known as: SYNTHROID Take 50 mcg by mouth daily.   metoprolol succinate 50 MG 24 hr tablet Commonly known as: Toprol XL Take 1 tablet (50 mg total) by mouth daily. Take with or immediately following a meal.   nicotine polacrilex 2 MG gum Commonly known as: NICORETTE Take 1 each (2 mg total) by mouth as needed for smoking cessation.   nitroGLYCERIN 0.4 MG SL tablet Commonly known as: Nitrostat Place 1 tablet (0.4 mg total) under the tongue every 5 (five) minutes as needed for chest pain.   norethindrone 5 MG tablet Commonly known as: Aygestin Take 1 tablet (5 mg total) by mouth daily.   rosuvastatin 20 MG tablet Commonly known as: CRESTOR Take 1 tablet (20 mg total) by mouth daily. Start taking on: January 13, 2021   sertraline 25 MG tablet Commonly known as: ZOLOFT Take 25 mg by mouth daily.           Outstanding Labs/Studies   MRI as outpatient  Duration of Discharge Encounter   Greater than 30 minutes including physician time.  Signed, Theodore Demark, PA-C 01/12/2021, 12:24 PM   I have seen and examined this patient with Theodore Demark.  Agree with above, note added to reflect my findings.  On exam, regular rhythm, no murmurs, lungs clear.  Patient presented to the hospital  with chest pain, found to have an elevated troponin.  Left heart catheterization showed no evidence of coronary artery disease but did have wall motion abnormality.  Was initially thought possibly due to myocarditis.  She is chest pain-free today.  We Nayelis Bonito plan for outpatient MRI and follow-up with general cardiology.  Greater than 30 minutes was performed on this discharge, more than half including physician time.   Kawhi Diebold M. Criston Chancellor MD  01/13/2021 10:50 AM

## 2021-01-24 ENCOUNTER — Ambulatory Visit (HOSPITAL_COMMUNITY): Admission: RE | Admit: 2021-01-24 | Payer: Self-pay | Source: Ambulatory Visit

## 2021-02-18 ENCOUNTER — Telehealth (HOSPITAL_COMMUNITY): Payer: Self-pay | Admitting: *Deleted

## 2021-02-18 NOTE — Telephone Encounter (Signed)
Patient returning call regarding upcoming cardiac imaging study; pt verbalizes understanding of appt date/time, parking situation and where to check in, and verified current allergies; name and call back number provided for further questions should they arise  Sheri Weill RN Navigator Cardiac Imaging [facility] [phone] office [phone] cell  Patient denies metal or claustrophobia. 

## 2021-02-18 NOTE — Telephone Encounter (Signed)
Attempted to call patient regarding upcoming cardiac MRI appointment. Voicemail box full and unable to leave a message.  Arlena Marsan RN Navigator Cardiac Imaging  [facility] [phone] Office [phone] Cell  

## 2021-02-19 ENCOUNTER — Ambulatory Visit (HOSPITAL_COMMUNITY): Admission: RE | Admit: 2021-02-19 | Payer: 59 | Source: Ambulatory Visit

## 2021-03-04 ENCOUNTER — Ambulatory Visit: Payer: 59 | Admitting: Adult Health

## 2021-03-08 ENCOUNTER — Ambulatory Visit: Payer: 59 | Admitting: Cardiovascular Disease

## 2021-03-18 ENCOUNTER — Telehealth (HOSPITAL_COMMUNITY): Payer: Self-pay | Admitting: Emergency Medicine

## 2021-03-18 NOTE — Telephone Encounter (Signed)
Reaching out to patient to offer assistance regarding upcoming cardiac imaging study; pt verbalizes understanding of appt date/time, parking situation and where to check in, pre-test NPO status and medications ordered, and verified current allergies; name and call back number provided for further questions should they arise Rockwell Alexandria RN Navigator Cardiac Imaging [facility] [phone] office [phone] cell  Difficult IV Denies metals Denies claustro Arrival [phone]

## 2021-03-19 ENCOUNTER — Ambulatory Visit (HOSPITAL_COMMUNITY)
Admission: RE | Admit: 2021-03-19 | Discharge: 2021-03-19 | Disposition: A | Discharge status: Home / Self Care | Payer: 59 | Source: Ambulatory Visit | Attending: Physician Assistant | Admitting: Physician Assistant

## 2021-03-19 ENCOUNTER — Other Ambulatory Visit: Payer: Self-pay

## 2021-03-19 DIAGNOSIS — I214 Non-ST elevation (NSTEMI) myocardial infarction: Secondary | ICD-10-CM | POA: Insufficient documentation

## 2021-03-19 MED ORDER — GADOBUTROL 1 MMOL/ML IV SOLN
8.0000 mL | Freq: Once | INTRAVENOUS | Status: AC | PRN
  Administered 2021-03-19: 8 mL via INTRAVENOUS

## 2021-03-21 ENCOUNTER — Ambulatory Visit: Payer: 59 | Admitting: Cardiovascular Disease

## 2021-03-22 ENCOUNTER — Encounter: Payer: Self-pay | Admitting: Cardiovascular Disease

## 2021-03-22 ENCOUNTER — Other Ambulatory Visit: Payer: Self-pay

## 2021-03-22 ENCOUNTER — Ambulatory Visit (INDEPENDENT_AMBULATORY_CARE_PROVIDER_SITE_OTHER): Payer: 59 | Admitting: Cardiovascular Disease

## 2021-03-22 VITALS — BP 108/72 | HR 59 | Ht 63.0 in | Wt 180.0 lb

## 2021-03-22 DIAGNOSIS — E782 Mixed hyperlipidemia: Secondary | ICD-10-CM | POA: Insufficient documentation

## 2021-03-22 DIAGNOSIS — I214 Non-ST elevation (NSTEMI) myocardial infarction: Secondary | ICD-10-CM

## 2021-03-22 DIAGNOSIS — Z720 Tobacco use: Secondary | ICD-10-CM | POA: Diagnosis not present

## 2021-03-22 DIAGNOSIS — E785 Hyperlipidemia, unspecified: Secondary | ICD-10-CM | POA: Insufficient documentation

## 2021-03-22 LAB — LIPID PANEL
Chol/HDL Ratio: 2.9 ratio (ref 0.0–4.4)
Cholesterol, Total: 115 mg/dL (ref 100–199)
HDL: 40 mg/dL (ref >39)
LDL Chol Calc (NIH): 59 mg/dL (ref 0–99)
Triglycerides: 82 mg/dL (ref 0–149)
VLDL Cholesterol Cal: 16 mg/dL (ref 5–40)

## 2021-03-22 LAB — HEPATIC FUNCTION PANEL
ALT: 16 IU/L (ref 0–32)
AST: 17 IU/L (ref 0–40)
Albumin: 4.2 g/dL (ref 3.8–4.8)
Alkaline Phosphatase: 55 IU/L (ref 44–121)
Bilirubin Total: 0.2 mg/dL (ref 0.0–1.2)
Bilirubin, Direct: <0.1 mg/dL (ref 0.00–0.40)
Total Protein: 6.5 g/dL (ref 6.0–8.5)

## 2021-03-22 NOTE — Progress Notes (Signed)
? ? ? ?03/22/2021 ?Sheri Vargas   ?[birth year]  ?[id number] ? ?Primary Physician Benita Stabile, MD ?Primary Cardiologist: Runell Gess MD Nicholes Calam[place], [place] ? ?HPI:  Sheri Vargas is a 46 y.o. moderately overweight married Caucasian female mother of 1 child currently does not work.  I initially saw her during her hospitalization at the end of December last year.  She was admitted on 01/10/2021 with chest pain.  She had "a non-STEMI" with troponins in the 1000 range.  She underwent cardiac catheterization the following day, 01/11/2021, by Dr. Katrinka Blazing revealing minimal plaquing in her LAD within the anterolateral/apical wall motion abnormality.  Her EF was normal although she did have wall motion abnormalities on that as well.  A follow-up cardiac MRI performed 03/19/2021 was essentially normal.  She said no recurrent symptoms.  Her risk factors do include continued tobacco abuse with over 60 pack years of smoking now smoking 1 pack a day, hypertension and hyperlipidemia.  She does have anxiety depression as well. ? ? ?Current Meds  ?Medication Sig  ? ALPRAZolam (XANAX) 0.5 MG tablet Take 0.5 mg by mouth 2 (two) times daily as needed for anxiety.   ? aspirin EC 81 MG EC tablet Take 1 tablet (81 mg total) by mouth daily. Swallow whole.  ? Aspirin-Acetaminophen (GOODYS BODY PAIN PO) Take 1 packet by mouth 2 (two) times daily as needed (headache pain).  ? levothyroxine (SYNTHROID) 50 MCG tablet Take 50 mcg by mouth daily.  ? metoprolol succinate (TOPROL XL) 50 MG 24 hr tablet Take 1 tablet (50 mg total) by mouth daily. Take with or immediately following a meal.  ? nicotine polacrilex (NICORETTE) 2 MG gum Take 1 each (2 mg total) by mouth as needed for smoking cessation.  ? nitroGLYCERIN (NITROSTAT) 0.4 MG SL tablet Place 1 tablet (0.4 mg total) under the tongue every 5 (five) minutes as needed for chest pain.  ? norethindrone (AYGESTIN) 5 MG tablet Take 1 tablet (5 mg total) by mouth daily.  ? rosuvastatin  (CRESTOR) 20 MG tablet Take 1 tablet (20 mg total) by mouth daily.  ? sertraline (ZOLOFT) 25 MG tablet Take 25 mg by mouth daily.  ?  ? ?Allergies  ?Allergen Reactions  ? Latex   ? ? ?Social History  ? ?Socioeconomic History  ? Marital status: Married  ?  Spouse name: Not on file  ? Number of children: 3  ? Years of education: Not on file  ? Highest education level: Not on file  ?Occupational History  ? Not on file  ?Tobacco Use  ? Smoking status: Every Day  ?  Packs/day: 1.00  ?  Years: 20.00  ?  Pack years: 20.00  ?  Types: Cigarettes  ?  Last attempt to quit: 01/20/1994  ?  Years since quitting: 27.1  ? Smokeless tobacco: Never  ?Vaping Use  ? Vaping Use: Never used  ?Substance and Sexual Activity  ? Alcohol use: No  ? Drug use: No  ? Sexual activity: Not Currently  ?  Birth control/protection: None, Abstinence  ?Other Topics Concern  ? Not on file  ?Social History Narrative  ? Not on file  ? ?Social Determinants of Health  ? ?Financial Resource Strain: High Risk  ? Difficulty of Paying Living Expenses: Very hard  ?Food Insecurity: No Food Insecurity  ? Worried About Programme researcher, broadcasting/film/video in the Last Year: Never true  ? Ran Out of Food in the Last Year: Never true  ?  Transportation Needs: Unmet Transportation Needs  ? Lack of Transportation (Medical): Yes  ? Lack of Transportation (Non-Medical): Yes  ?Physical Activity: Insufficiently Active  ? Days of Exercise per Week: 2 days  ? Minutes of Exercise per Session: 10 min  ?Stress: Stress Concern Present  ? Feeling of Stress : Very much  ?Social Connections: Moderately Integrated  ? Frequency of Communication with Friends and Family: More than three times a week  ? Frequency of Social Gatherings with Friends and Family: Once a week  ? Attends Religious Services: 1 to 4 times per year  ? Active Member of Clubs or Organizations: No  ? Attends Banker Meetings: Never  ? Marital Status: Married  ?Intimate Partner Violence: Unknown  ? Fear of Current or  Ex-Partner: Patient refused  ? Emotionally Abused: Patient refused  ? Physically Abused: No  ? Sexually Abused: No  ?  ? ?Review of Systems: ?General: negative for chills, fever, night sweats or weight changes.  ?Cardiovascular: negative for chest pain, dyspnea on exertion, edema, orthopnea, palpitations, paroxysmal nocturnal dyspnea or shortness of breath ?Dermatological: negative for rash ?Respiratory: negative for cough or wheezing ?Urologic: negative for hematuria ?Abdominal: negative for nausea, vomiting, diarrhea, bright red blood per rectum, melena, or hematemesis ?Neurologic: negative for visual changes, syncope, or dizziness ?All other systems reviewed and are otherwise negative except as noted above. ? ? ? ?Blood pressure 108/72, pulse (!) 59, height 5\' 3"  (1.6 m), weight 180 lb (81.6 kg).  ?General appearance: alert and no distress ?Neck: no adenopathy, no carotid bruit, no JVD, supple, symmetrical, trachea midline, and thyroid not enlarged, symmetric, no tenderness/mass/nodules ?Lungs: clear to auscultation bilaterally ?Heart: regular rate and rhythm, S1, S2 normal, no murmur, click, rub or gallop ?Extremities: extremities normal, atraumatic, no cyanosis or edema ?Pulses: 2+ and symmetric ?Skin: Skin color, texture, turgor normal. No rashes or lesions ?Neurologic: Grossly normal ? ?EKG sinus bradycardia 59 without ST or T wave changes.  Personally reviewed this EKG. ? ?ASSESSMENT AND PLAN:  ? ?Tobacco abuse ?Greater than 60-pack-year tobacco abuse having smoked 2 packs a day until recently and now if she is smoking 1 pack/day with the intent to stop. ? ?Hyperlipidemia ?History of hyperlipidemia currently on high-dose statin therapy.  Her lipid profile performed during her index hospitalization at the end of December 2022 revealed total cholesterol 183, LDL 120 and HDL 49.  We will recheck a fasting lipid liver profile this morning. ? ?NSTEMI (non-ST elevated myocardial infarction) (HCC) ?History of  non-STEMI on 01/10/2021 with troponins went up to the thousand range.  She had cardiac catheterization performed on 01/11/2021 by Dr. 01/13/2021 revealing minimal plaquing otherwise no significant CAD with an EF of 50 to 55% and wall motion abnormality consistent with potential "Takotsubo syndrome.  Her echocardiogram however revealed EF of 60 to 65% with wall motion abnormalities.  She had a subsequent cardiac MRI performed 03/19/2021 that was essentially normal.  She had no recurrent symptoms.  She is on aspirin and a beta-blocker. ? ? ? ? ?03/21/2021 MD FACP,FACC,FAHA, FSCAI ?03/22/2021 ?9:00 AM ?

## 2021-03-22 NOTE — Assessment & Plan Note (Signed)
History of hyperlipidemia currently on high-dose statin therapy.  Her lipid profile performed during her index hospitalization at the end of December 2022 revealed total cholesterol 183, LDL 120 and HDL 49.  We will recheck a fasting lipid liver profile this morning. ?

## 2021-03-22 NOTE — Patient Instructions (Signed)
Medication Instructions:  ?Your physician recommends that you continue on your current medications as directed. Please refer to the Current Medication list given to you today. ? ?*If you need a refill on your cardiac medications before your next appointment, please call your pharmacy* ? ? ?Lab Work: ?Your physician recommends that you have labs drawn today: Lipid/liver panel ? ?If you have labs (blood work) drawn today and your tests are completely normal, you will receive your results only by: ?MyChart Message (if you have MyChart) OR ?A paper copy in the mail ?If you have any lab test that is abnormal or we need to change your treatment, we will call you to review the results. ? ? ?Follow-Up: ?At [facility], you and your health needs are our priority.  As part of our continuing mission to provide you with exceptional heart care, we have created designated Provider Care Teams.  These Care Teams include your primary Cardiologist (physician) and Advanced Practice Providers (APPs -  Physician Assistants and Nurse Practitioners) who all work together to provide you with the care you need, when you need it. ? ?We recommend signing up for the patient portal called "MyChart".  Sign up information is provided on this After Visit Summary.  MyChart is used to connect with patients for Virtual Visits (Telemedicine).  Patients are able to view lab/test results, encounter notes, upcoming appointments, etc.  Non-urgent messages can be sent to your provider as well.   ?To learn more about what you can do with MyChart, go to [email or web address].   ? ?Your next appointment:   ?6 month(s) ? ?The format for your next appointment:   ?In Person ? ?Provider:   ?Coletta Memos, FNP, Fabian Sharp, PA-C, Sande Rives, PA-C, Caron Presume, PA-C, Jory Sims, DNP, ANP, or Almyra Deforest, PA-C    ? ? ?Then, Quay Burow, MD will plan to see you again in 12 month(s). ?

## 2021-03-22 NOTE — Assessment & Plan Note (Signed)
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  She had cardiac catheterization performed on 01/11/2021 by Dr. Katrinka Blazing revealing minimal plaquing otherwise no significant CAD with an EF of 50 to 55% and wall motion abnormality consistent with potential "Takotsubo syndrome.  Her echocardiogram however revealed EF of 60 to 65% with wall motion abnormalities.  She had a subsequent cardiac MRI performed 03/19/2021 that was essentially normal.  She had no recurrent symptoms.  She is on aspirin and a beta-blocker. ?

## 2021-03-22 NOTE — Assessment & Plan Note (Signed)
Greater than 60-pack-year tobacco abuse having smoked 2 packs a day until recently and now if she is smoking 1 pack/day with the intent to stop. ?

## 2021-03-25 ENCOUNTER — Encounter: Payer: Self-pay | Admitting: Cardiovascular Disease

## 2021-04-24 ENCOUNTER — Ambulatory Visit: Payer: 59 | Admitting: Physician Assistant

## 2021-05-08 ENCOUNTER — Ambulatory Visit (HOSPITAL_COMMUNITY): Payer: Self-pay | Admitting: Psychiatry

## 2021-05-08 ENCOUNTER — Other Ambulatory Visit: Payer: Self-pay

## 2021-05-08 ENCOUNTER — Encounter (HOSPITAL_COMMUNITY): Payer: Self-pay | Admitting: Emergency Medicine

## 2021-05-08 ENCOUNTER — Emergency Department (HOSPITAL_COMMUNITY)
Admission: EM | Admit: 2021-05-08 | Discharge: 2021-05-09 | Disposition: A | Discharge status: Home / Self Care | Payer: 59 | Attending: Emergency Medicine | Admitting: Emergency Medicine

## 2021-05-08 ENCOUNTER — Emergency Department (HOSPITAL_COMMUNITY): Payer: 59

## 2021-05-08 DIAGNOSIS — Z79899 Other long term (current) drug therapy: Secondary | ICD-10-CM | POA: Insufficient documentation

## 2021-05-08 DIAGNOSIS — R0789 Other chest pain: Secondary | ICD-10-CM | POA: Insufficient documentation

## 2021-05-08 DIAGNOSIS — Z7982 Long term (current) use of aspirin: Secondary | ICD-10-CM | POA: Insufficient documentation

## 2021-05-08 DIAGNOSIS — R079 Chest pain, unspecified: Secondary | ICD-10-CM

## 2021-05-08 DIAGNOSIS — Z91040 Latex allergy status: Secondary | ICD-10-CM | POA: Insufficient documentation

## 2021-05-08 LAB — BASIC METABOLIC PANEL
Anion gap: 9 (ref 5–15)
BUN: 9 mg/dL (ref 6–20)
CO2: 20 mmol/L — ABNORMAL LOW (ref 22–32)
Calcium: 9.4 mg/dL (ref 8.9–10.3)
Chloride: 108 mmol/L (ref 98–111)
Creatinine, Ser: 0.72 mg/dL (ref 0.44–1.00)
GFR, Estimated: >60 mL/min (ref >60)
Glucose, Bld: 111 mg/dL — ABNORMAL HIGH (ref 70–99)
Potassium: 3.8 mmol/L (ref 3.5–5.1)
Sodium: 137 mmol/L (ref 135–145)

## 2021-05-08 LAB — CBC
HCT: 43.4 % (ref 36.0–46.0)
Hemoglobin: 14.9 g/dL (ref 12.0–15.0)
MCH: 31.5 pg (ref 26.0–34.0)
MCHC: 34.3 g/dL (ref 30.0–36.0)
MCV: 91.8 fL (ref 80.0–100.0)
Platelets: 515 10*3/uL — ABNORMAL HIGH (ref 150–400)
RBC: 4.73 MIL/uL (ref 3.87–5.11)
RDW: 14.5 % (ref 11.5–15.5)
WBC: 9.5 10*3/uL (ref 4.0–10.5)
nRBC: 0 % (ref 0.0–0.2)

## 2021-05-08 LAB — I-STAT BETA HCG BLOOD, ED (MC, WL, AP ONLY): I-stat hCG, quantitative: <5 m[IU]/mL (ref <5)

## 2021-05-08 LAB — TROPONIN I (HIGH SENSITIVITY): Troponin I (High Sensitivity): 25 ng/L — ABNORMAL HIGH (ref <18)

## 2021-05-08 NOTE — ED Provider Triage Note (Signed)
Emergency Medicine Provider Triage Evaluation Note ? ?Sheri Vargas , a 46 y.o. female  was evaluated in triage.  Pt complains of 3 days worth of chest pain.  Heart attack in December.  Denies shortness of breath but says she has tingling down her left arm. ? ?Review of Systems  ?Positive: Chest pain, left arm tingling ?Negative: Shortness of breath ? ?Physical Exam  ?BP (!) 133/95 (BP Location: Left Arm)   Pulse 77   Temp 98 ?F (36.7 ?C) (Oral)   Resp 18   SpO2 99%  ?Gen:   Awake, no distress   ?Resp:  Normal effort  ?MSK:   Moves extremities without difficulty  ?Other:  RRR, lung sounds clear ? ?Medical Decision Making  ?Medically screening exam initiated at 9:59 PM.  Appropriate orders placed.  IRIDESSA HARROW was informed that the remainder of the evaluation will be completed by another provider, this initial triage assessment does not replace that evaluation, and the importance of remaining in the ED until their evaluation is complete. ? ? ? ?Is requesting an x-ray of her head to see her sinuses ?  ?Aerith Canal A, PA-C ?05/08/21 2200 ? ?

## 2021-05-08 NOTE — ED Triage Notes (Signed)
Pt c/o chest pain x 3 days, pain radiates to her left jaw, left shoulder and left arm. Denies shortness of breath. Hx MI. ?

## 2021-05-09 LAB — TROPONIN I (HIGH SENSITIVITY): Troponin I (High Sensitivity): 25 ng/L — ABNORMAL HIGH (ref <18)

## 2021-05-09 NOTE — ED Provider Notes (Signed)
?[facility] EMERGENCY DEPARTMENT ?Provider Note ? ? ?CSN: [id number] ?Arrival date & time: 05/08/21  2135 ? ?  ? ?History ? ?Chief Complaint  ?Patient presents with  ? Chest Pain  ? ? ?Sheri Vargas is a 46 y.o. female. ? ?46 year old female presents with complaint of dull discomfort on the left side of her chest which radiates to her left side jaw and left side of face.  Discomfort started on Tuesday (05/07/2021).  Patient states that she has been depressed since the death of her father in 04-30-2022 of last year, had an NSTEMI in December.  Reports noncompliant with her levothyroxine, just had increase in her dose of Zoloft from 50 to 100 mg, feels like with this increase she had more energy Tuesday and was up around her house cleaning.  Afterwards, noticed that she had this dull sensation in her chest which became progressively worse throughout the middle the night until 4:30 in the morning on Wednesday at which time she took an extra dose of her metoprolol and a Xanax and was able to fall asleep.  States that she does not sleep usual hours as she is depressed and sleeps frequently throughout the day.  Patient woke up at some point, had ongoing discomfort in her chest slept again from noon until 6 PM yesterday evening.  Upon waking, noticed ongoing/intermittent discomfort in her chest.  Patient states that the discomfort in her jaw was similar to when she had her heart attack last December which prompted her to come in this evening.  Reports her pain has completely resolved while waiting in the lobby earlier this evening and she remains pain-free at this time.  States that she is mildly short of breath at times, unsure if related to her chest pain.  She is a daily smoker.  Reports a mild nonproductive cough which is new for her today, intermittent chills, denies diaphoresis.  Denies lower extremity edema. ? ? ?  ? ?Home Medications ?Prior to Admission medications   ?Medication Sig Start Date End  Date Taking? Authorizing Provider  ?ALPRAZolam (XANAX) 0.5 MG tablet Take 0.5 mg by mouth 2 (two) times daily as needed for anxiety.  08/04/14  Yes [provider]  ?aspirin EC 81 MG EC tablet Take 1 tablet (81 mg total) by mouth daily. Swallow whole. 01/13/21  Yes Barrett, Joline Salt, PA-C  ?Aspirin-Acetaminophen (GOODYS BODY PAIN PO) Take 1 packet by mouth 2 (two) times daily as needed (headache pain).   Yes [provider]  ?fluticasone (FLONASE) 50 MCG/ACT nasal spray Place 1 spray into both nostrils daily. 01/30/21  Yes [provider]  ?metoprolol succinate (TOPROL XL) 50 MG 24 hr tablet Take 1 tablet (50 mg total) by mouth daily. Take with or immediately following a meal. 01/12/21  Yes Barrett, Joline Salt, PA-C  ?sertraline (ZOLOFT) 50 MG tablet Take 100 mg by mouth daily. 03/18/21  Yes [provider]  ?levothyroxine (SYNTHROID) 50 MCG tablet Take 50 mcg by mouth daily. ?Patient not taking: Reported on 05/08/2021 12/03/20   [provider]  ?nicotine polacrilex (NICORETTE) 2 MG gum Take 1 each (2 mg total) by mouth as needed for smoking cessation. ?Patient not taking: Reported on 05/08/2021 01/12/21   Barrett, Joline Salt, PA-C  ?nitroGLYCERIN (NITROSTAT) 0.4 MG SL tablet Place 1 tablet (0.4 mg total) under the tongue every 5 (five) minutes as needed for chest pain. ?Patient not taking: Reported on 05/08/2021 01/12/21   Barrett, Joline Salt, PA-C  ?norethindrone (  AYGESTIN) 5 MG tablet Take 1 tablet (5 mg total) by mouth daily. ?Patient not taking: Reported on 05/08/2021 01/09/21   Adline Potter, NP  ?rosuvastatin (CRESTOR) 20 MG tablet Take 1 tablet (20 mg total) by mouth daily. ?Patient not taking: Reported on 05/08/2021 01/13/21   Barrett, Joline Salt, PA-C  ?sertraline (ZOLOFT) 25 MG tablet Take 25 mg by mouth daily. ?Patient not taking: Reported on 05/08/2021 01/08/21   [provider]  ?citalopram (CELEXA) 10 MG tablet Take 10 mg by mouth daily.  02/15/20  [provider]  ?   ? ?Allergies    ?Latex   ? ?Review of Systems   ?Review of Systems ?Negative except as per HPI ?Physical Exam ?Updated Vital Signs ?BP 105/82 (BP Location: Right Arm)   Pulse 62   Temp 98.1 ?F (36.7 ?C) (Oral)   Resp 18   SpO2 99%  ?Physical Exam ?Vitals and nursing note reviewed.  ?Constitutional:   ?   General: She is not in acute distress. ?   Appearance: She is well-developed. She is not diaphoretic.  ?HENT:  ?   Head: Normocephalic and atraumatic.  ?Cardiovascular:  ?   Rate and Rhythm: Normal rate and regular rhythm.  ?   Heart sounds: Normal heart sounds. No murmur heard. ?Pulmonary:  ?   Effort: Pulmonary effort is normal.  ?   Breath sounds: Normal breath sounds.  ?Chest:  ?   Chest wall: No tenderness.  ?Abdominal:  ?   Palpations: Abdomen is soft.  ?   Tenderness: There is no abdominal tenderness.  ?Musculoskeletal:  ?   Cervical back: Neck supple.  ?   Right lower leg: No tenderness. No edema.  ?   Left lower leg: No tenderness. No edema.  ?Skin: ?   General: Skin is warm and dry.  ?   Findings: No erythema or rash.  ?Neurological:  ?   Mental Status: She is alert and oriented to person, place, and time.  ?Psychiatric:     ?   Behavior: Behavior normal.  ? ? ?ED Results / Procedures / Treatments   ?Labs ?(all labs ordered are listed, but only abnormal results are displayed) ?Labs Reviewed  ?BASIC METABOLIC PANEL - Abnormal; Notable for the following components:  ?    Result Value  ? CO2 20 (*)   ? Glucose, Bld 111 (*)   ? All other components within normal limits  ?CBC - Abnormal; Notable for the following components:  ? Platelets 515 (*)   ? All other components within normal limits  ?TROPONIN I (HIGH SENSITIVITY) - Abnormal; Notable for the following components:  ? Troponin I (High Sensitivity) 25 (*)   ? All other components within normal limits  ?TROPONIN I (HIGH SENSITIVITY) - Abnormal; Notable for the following components:  ? Troponin I (High Sensitivity) 25 (*)   ? All  other components within normal limits  ?I-STAT BETA HCG BLOOD, ED ([facility], [facility], [facility] ONLY)  ? ? ?EKG ?EKG Interpretation ? ?Date/Time:  Wednesday May 08 2021 21:47:17 EDT ?Ventricular Rate:  78 ?PR Interval:  142 ?QRS Duration: 52 ?QT Interval:  392 ?QTC Calculation: 446 ?R Axis:   60 ?Text Interpretation: Normal sinus rhythm Low voltage QRS Borderline ECG When compared with ECG of 11-Jan-2021 01:50, PREVIOUS ECG IS PRESENT Confirmed by Kennis Carina [phone]) on 05/09/2021 1:36:59 AM ? ?Radiology ?DG Chest 2 View ? ?Result Date: 05/08/2021 ?CLINICAL DATA:  Mid chest pain.  History of smoking. EXAM: CHEST -  2 VIEW COMPARISON:  CTA chest 01/10/2021. FINDINGS: The heart size and mediastinal contours are within normal limits with atherosclerosis again seen in the transverse aortic segment. Both lungs are clear. The visualized skeletal structures are unremarkable apart from slight thoracic kyphodextroscoliosis. IMPRESSION: No evidence of acute chest disease. Left perihilar infiltrates noted previously are not seen today. Electronically Signed   By: Almira BarKeith  Chesser M.D.   On: 05/08/2021 22:23   ? ?Procedures ?Procedures  ? ? ?Medications Ordered in ED ?Medications - No data to display ? ?ED Course/ Medical Decision Making/ A&P ?  ?                        ?Medical Decision Making ? ?This patient presents to the ED for concern of chest pain, this involves an extensive number of treatment options, and is a complaint that carries with it a high risk of complications and morbidity.  The differential diagnosis includes STEMI, NSTEMI, ACS, GERD, arrhythmia ? ? ?Co morbidities that complicate the patient evaluation ? ?Prior NSTEMI in 01/08/2021, depression, anxiety, thyroid disease ? ? ?Additional history obtained: ? ?External records from outside source obtained and reviewed including heart cath report from 01/11/2021 ? ? ?Lab Tests: ? ?I Ordered, and personally interpreted labs.  The pertinent results include: Troponin x2, 25 and 25.   CBC unremarkable, BMP without significant findings.  hCG negative ? ? ?Imaging Studies ordered: ? ?I ordered imaging studies including chest x-ray ?I independently visualized and interpreted imaging which showed

## 2021-05-10 ENCOUNTER — Encounter: Payer: Self-pay | Admitting: Cardiovascular Disease

## 2021-05-10 DIAGNOSIS — J329 Chronic sinusitis, unspecified: Secondary | ICD-10-CM

## 2021-05-14 ENCOUNTER — Ambulatory Visit (INDEPENDENT_AMBULATORY_CARE_PROVIDER_SITE_OTHER): Payer: 59 | Admitting: Gastroenterology

## 2021-05-14 ENCOUNTER — Encounter (INDEPENDENT_AMBULATORY_CARE_PROVIDER_SITE_OTHER): Payer: Self-pay | Admitting: Gastroenterology

## 2021-05-14 ENCOUNTER — Telehealth: Payer: Self-pay

## 2021-05-14 NOTE — Telephone Encounter (Signed)
Called patient to reschedule appointment with [name]. Appointment rescheduled for 5/10 with J. Cleaver 10:30 AM. ?

## 2021-05-15 ENCOUNTER — Ambulatory Visit: Payer: 59 | Admitting: Physician Assistant

## 2021-05-15 LAB — LIPID PANEL
Cholesterol: 173 (ref 0–200)
HDL: 42 (ref 35–70)
LDL Cholesterol: 113
Triglycerides: 99 (ref 40–160)

## 2021-05-15 LAB — BASIC METABOLIC PANEL
BUN: 13 (ref 4–21)
Creatinine: 0.6 (ref 0.5–1.1)

## 2021-05-15 LAB — VITAMIN D 25 HYDROXY (VIT D DEFICIENCY, FRACTURES): Vit D, 25-Hydroxy: 16.4

## 2021-05-15 LAB — TSH: TSH: 2.69 (ref 0.41–5.90)

## 2021-05-25 NOTE — Progress Notes (Signed)
? ?Cardiology Clinic Note  ? ?Patient Name: Sheri Vargas ?Date of Encounter: 05/28/2021 ? ?Primary Care Provider:  Celene Squibb, MD ?Primary Cardiologist:  Quay Burow, MD ? ?Patient Profile  ?  ?Sheri Vargas 46 year old female presents the clinic today for follow-up evaluation of her coronary artery disease and hyperlipidemia. ? ?Past Medical History  ?  ?Past Medical History:  ?Diagnosis Date  ? Anxiety   ? Chronic back pain 06/17/2012  ? Decreased libido 06/17/2012  ? Degenerative disc disease   ? Depression   ? Ectopic pregnancy, tubal   ? 5 ectopic pregnancies  ? Hypothyroid 06/25/2012  ? TSH 5.561, will rx synthroid 25 mcg and recheck in 8 weeks  ? Kidney stones   ? Menorrhagia 06/17/2012  ? Thyroid disease   ? ?Past Surgical History:  ?Procedure Laterality Date  ? BACK SURGERY    ? cyst removed  ? CHOLECYSTECTOMY  2004  ? ECTOPIC PREGNANCY SURGERY    ? LEFT HEART CATH AND CORONARY ANGIOGRAPHY N/A 01/11/2021  ? Procedure: LEFT HEART CATH AND CORONARY ANGIOGRAPHY;  Surgeon: Belva Crome, MD;  Location: [facility];  Service: Cardiovascular;  Laterality: N/A;  ? ? ?Allergies ? ?Allergies  ?Allergen Reactions  ? Latex   ? ? ?History of Present Illness  ?  ?[name] has a PMH of hyperlipidemia, coronary artery disease, tobacco abuse, anxiety, infective colitis, and hypothyroidism. ? ?She was seen in follow-up by Dr. Gwenlyn Found on 03/22/2021.  During that time her 01/10/2021 hospitalization and NSTEMI was reviewed.  Her troponins elevated to 1000.  She underwent cardiac catheterization on 01/11/2021 by Dr. Tamala Julian.  She was found to have minimal plaque in her LAD.  She was also noted to have anterior lateral/apical wall motion abnormality.  Her EF was normal but she was noted to have wall motion abnormalities.  A cardiac MRI 03/19/2021 showed normal function.  During her follow-up evaluation she denied recurrent symptoms.  She continues to smoke about a pack per day.  Her EKG showed sinus bradycardia  with no ST or T wave deviation.  Smoking cessation was recommended.  Her lipid panel was redrawn showed an LDL cholesterol of 59. ? ?She contacted the cardiology clinic 05/10/2021 and reported that she is feeling weak and having anxiety related to medications. ? ?She presents to the clinic today for follow-up evaluation states she continues to have intermittent periods of left neck shoulder and chest discomfort.  We reviewed her cardiac catheterization and cardiac MRI.  She expressed understanding.  We also reviewed her cardiac troponin levels.  She reports that she was taking 50 mg of metoprolol and reduced it to 25 mg because she felt fatigued.  Her blood pressure today is 110/80 with a pulse of 58.  I will continue her 25 mg of metoprolol.  Her pain appears to be neuropathic in nature.  I will refer her to neurology and plan follow-up for 9 to 12 months with Dr. Gwenlyn Found. ? ?Today she denies chest pain, shortness of breath, lower extremity edema, fatigue, palpitations, melena, hematuria, hemoptysis, diaphoresis, weakness, presyncope, syncope, orthopnea, and PND. ? ? ?Home Medications  ?  ?Prior to Admission medications   ?Medication Sig Start Date End Date Taking? Authorizing Provider  ?ALPRAZolam (XANAX) 0.5 MG tablet Take 0.5 mg by mouth 2 (two) times daily as needed for anxiety.  08/04/14   [provider]  ?aspirin EC 81 MG EC tablet Take 1 tablet (81 mg total) by mouth daily.  Swallow whole. 01/13/21   Barrett, Evelene Croon, PA-C  ?Aspirin-Acetaminophen (GOODYS BODY PAIN PO) Take 1 packet by mouth 2 (two) times daily as needed (headache pain).    [provider]  ?fluticasone (FLONASE) 50 MCG/ACT nasal spray Place 1 spray into both nostrils daily. 01/30/21   [provider]  ?levothyroxine (SYNTHROID) 50 MCG tablet Take 50 mcg by mouth daily. ?Patient not taking: Reported on 05/08/2021 12/03/20   [provider]  ?metoprolol succinate (TOPROL XL) 50 MG 24 hr tablet Take 1 tablet (50  mg total) by mouth daily. Take with or immediately following a meal. 01/12/21   Barrett, Evelene Croon, PA-C  ?nicotine polacrilex (NICORETTE) 2 MG gum Take 1 each (2 mg total) by mouth as needed for smoking cessation. ?Patient not taking: Reported on 05/08/2021 01/12/21   Barrett, Evelene Croon, PA-C  ?nitroGLYCERIN (NITROSTAT) 0.4 MG SL tablet Place 1 tablet (0.4 mg total) under the tongue every 5 (five) minutes as needed for chest pain. ?Patient not taking: Reported on 05/08/2021 01/12/21   Barrett, Evelene Croon, PA-C  ?norethindrone (AYGESTIN) 5 MG tablet Take 1 tablet (5 mg total) by mouth daily. ?Patient not taking: Reported on 05/08/2021 01/09/21   Estill Dooms, NP  ?rosuvastatin (CRESTOR) 20 MG tablet Take 1 tablet (20 mg total) by mouth daily. ?Patient not taking: Reported on 05/08/2021 01/13/21   Barrett, Evelene Croon, PA-C  ?sertraline (ZOLOFT) 25 MG tablet Take 25 mg by mouth daily. ?Patient not taking: Reported on 05/08/2021 01/08/21   [provider]  ?sertraline (ZOLOFT) 50 MG tablet Take 100 mg by mouth daily. 03/18/21   [provider]  ?citalopram (CELEXA) 10 MG tablet Take 10 mg by mouth daily.  02/15/20  [provider]  ? ? ?Family History  ?  ?Family History  ?Problem Relation Age of Onset  ? Diabetes Paternal Grandmother   ? Hypertension Father   ? Kidney Stones Brother   ? Hypertension Son   ? Hypertension Paternal Uncle   ? Hypertension Paternal Uncle   ? ?She indicated that her mother is alive. She indicated that her father is alive. She indicated that all of her five brothers are alive. She indicated that her maternal grandmother is deceased. She indicated that her maternal grandfather is deceased. She indicated that her paternal grandmother is deceased. She indicated that her paternal grandfather is deceased. She indicated that her son is alive. ? ?Social History  ?  ?Social History  ? ?Socioeconomic History  ? Marital status: Married  ?  Spouse name: Not on file  ? Number of  children: 3  ? Years of education: Not on file  ? Highest education level: Not on file  ?Occupational History  ? Not on file  ?Tobacco Use  ? Smoking status: Every Day  ?  Packs/day: 1.00  ?  Years: 20.00  ?  Pack years: 20.00  ?  Types: Cigarettes  ?  Last attempt to quit: 01/20/1994  ?  Years since quitting: 27.3  ? Smokeless tobacco: Never  ?Vaping Use  ? Vaping Use: Never used  ?Substance and Sexual Activity  ? Alcohol use: No  ? Drug use: No  ? Sexual activity: Not Currently  ?  Birth control/protection: None, Abstinence  ?Other Topics Concern  ? Not on file  ?Social History Narrative  ? Not on file  ? ?Social Determinants of Health  ? ?Financial Resource Strain: High Risk  ? Difficulty of Paying Living Expenses: Very hard  ?Food Insecurity:  No Food Insecurity  ? Worried About Charity fundraiser in the Last Year: Never true  ? Ran Out of Food in the Last Year: Never true  ?Transportation Needs: Unmet Transportation Needs  ? Lack of Transportation (Medical): Yes  ? Lack of Transportation (Non-Medical): Yes  ?Physical Activity: Insufficiently Active  ? Days of Exercise per Week: 2 days  ? Minutes of Exercise per Session: 10 min  ?Stress: Stress Concern Present  ? Feeling of Stress : Very much  ?Social Connections: Moderately Integrated  ? Frequency of Communication with Friends and Family: More than three times a week  ? Frequency of Social Gatherings with Friends and Family: Once a week  ? Attends Religious Services: 1 to 4 times per year  ? Active Member of Clubs or Organizations: No  ? Attends Archivist Meetings: Never  ? Marital Status: Married  ?Intimate Partner Violence: Unknown  ? Fear of Current or Ex-Partner: Patient refused  ? Emotionally Abused: Patient refused  ? Physically Abused: No  ? Sexually Abused: No  ?  ? ?Review of Systems  ?  ?General:  No chills, fever, night sweats or weight changes.  ?Cardiovascular:  No chest pain, dyspnea on exertion, edema, orthopnea, palpitations, paroxysmal  nocturnal dyspnea. ?Dermatological: No rash, lesions/masses ?Respiratory: No cough, dyspnea ?Urologic: No hematuria, dysuria ?Abdominal:   No nausea, vomiting, diarrhea, bright red blood per rectum, mel

## 2021-05-28 ENCOUNTER — Ambulatory Visit: Payer: 59 | Admitting: General Practice

## 2021-05-28 ENCOUNTER — Ambulatory Visit (INDEPENDENT_AMBULATORY_CARE_PROVIDER_SITE_OTHER): Payer: 59 | Admitting: Gastroenterology

## 2021-05-28 ENCOUNTER — Encounter: Payer: Self-pay | Admitting: General Practice

## 2021-05-28 ENCOUNTER — Ambulatory Visit: Payer: 59 | Admitting: Cardiovascular Disease

## 2021-05-28 VITALS — BP 110/80 | HR 58 | Ht 63.0 in | Wt 178.0 lb

## 2021-05-28 DIAGNOSIS — Z720 Tobacco use: Secondary | ICD-10-CM | POA: Diagnosis not present

## 2021-05-28 DIAGNOSIS — I2510 Atherosclerotic heart disease of native coronary artery without angina pectoris: Secondary | ICD-10-CM | POA: Diagnosis not present

## 2021-05-28 DIAGNOSIS — G8929 Other chronic pain: Secondary | ICD-10-CM

## 2021-05-28 DIAGNOSIS — E782 Mixed hyperlipidemia: Secondary | ICD-10-CM

## 2021-05-28 MED ORDER — METOPROLOL SUCCINATE ER 25 MG PO TB24: 25.0000 mg | ORAL_TABLET | Freq: Every day | ORAL | 3 refills | Status: DC

## 2021-05-28 NOTE — Patient Instructions (Signed)
Medication Instructions:  ?Take metoprolol succinate 25 mg daily. ?Your Physician recommend you continue on your current medication as directed.   ? ?*If you need a refill on your cardiac medications before your next appointment, please call your pharmacy* ? ? ?Follow-Up: ?At [facility], you and your health needs are our priority.  As part of our continuing mission to provide you with exceptional heart care, we have created designated Provider Care Teams.  These Care Teams include your primary Cardiologist (physician) and Advanced Practice Providers (APPs -  Physician Assistants and Nurse Practitioners) who all work together to provide you with the care you need, when you need it. ? ?We recommend signing up for the patient portal called "MyChart".  Sign up information is provided on this After Visit Summary.  MyChart is used to connect with patients for Virtual Visits (Telemedicine).  Patients are able to view lab/test results, encounter notes, upcoming appointments, etc.  Non-urgent messages can be sent to your provider as well.   ?To learn more about what you can do with MyChart, go to [email or web address].   ? ?Your next appointment:   ?9 month(s) ? ?The format for your next appointment:   ?In Person ? ?Provider:   ?Nanetta Batty, MD  ? ? ?Other Instructions ?Mindfulness-Based Stress Reduction ?Mindfulness-based stress reduction (MBSR) is a program that helps people learn to practice mindfulness. Mindfulness is the practice of consciously paying attention to the present moment. MBSR focuses on developing self-awareness, which lets you respond to life stress without judgment or negative feelings. It can be learned and practiced through techniques such as education, breathing exercises, meditation, and yoga. MBSR includes several mindfulness techniques in one program. ?MBSR works best when you understand the treatment, are willing to try new things, and can commit to spending time practicing what you learn.  MBSR training may include learning about: ?How your feelings, thoughts, and reactions affect your body. ?New ways to respond to things that cause negative thoughts to start (triggers). ?How to notice your thoughts and let go of them. ?Practicing awareness of everyday things that you normally do without thinking. ?The techniques and goals of different types of meditation. ?What are the benefits of MBSR? ?MBSR can have many benefits, which include helping you to: ?Develop self-awareness. This means knowing and understanding yourself. ?Learn skills and attitudes that help you to take part in your own health care. ?Learn new ways to care for yourself. ?Be more accepting about how things are, and let things go. ?Be less judgmental and approach things with an open mind. ?Be patient with yourself and trust yourself more. ?MBSR has also been shown to: ?Reduce negative emotions, such as sadness, overwhelm, and worry. ?Improve memory and focus. ?Change how you sense and react to pain. ?Boost your body's ability to fight infections. ?Help you connect better with other people. ?Improve your sense of well-being. ?How to practice mindfulness ?To do a basic awareness exercise: ?Find a comfortable place to sit. ?Pay attention to the present moment. Notice your thoughts, feelings, and surroundings just as they are. ?Avoid judging yourself, your feelings, or your surroundings. Make note of any judgment that comes up and let it go. ?Your mind may wander, and that is okay. Make note of when your thoughts drift, and return your attention to the present moment. ?To do basic mindfulness meditation: ?Find a comfortable place to sit. This may include a stable chair or a firm floor cushion. ?Sit upright with your back straight. Let your arms fall next  to your sides, with your hands resting on your legs. ?If you are sitting in a chair, rest your feet flat on the floor. ?If you are sitting on a cushion, cross your legs in front of you. ?Keep  your head in a neutral position with your chin dropped slightly. Relax your jaw and rest the tip of your tongue on the roof of your mouth. Drop your gaze to the floor or close your eyes. ?Breathe normally and pay attention to your breath. Feel the air moving in and out of your nose. Feel your belly expanding and relaxing with each breath. ?Your mind may wander, and that is okay. Make note of when your thoughts drift, and return your attention to your breath. ?Avoid judging yourself, your feelings, or your surroundings. Make note of any judgment or feelings that come up, let them go, and bring your attention back to your breath. ?When you are ready, lift your gaze or open your eyes. Pay attention to how your body feels after the meditation. ?Follow these instructions at home: ? ?Find a local in-person or online MBSR program. ?Set aside some time regularly for mindfulness practice. Practice every day if you can. Even 10 minutes of practice is helpful. ?Find a mindfulness practice that works best for you. This may include one or more of the following: ?Meditation. This involves focusing your mind on a certain thought or activity. ?Breathing awareness exercises. These help you to stay present by focusing on your breath. ?Body scan. For this practice, you lie down and pay attention to each part of your body from head to toe. You can identify tension and soreness and consciously relax parts of your body. ?Yoga. Yoga involves stretching and breathing, and it can improve your ability to move and be flexible. It can also help you to test your body's limits, which can help you release stress. ?Mindful eating. This way of eating involves focusing on the taste, texture, color, and smell of each bite of food. This slows down eating and helps you feel full sooner. For this reason, it can be an important part of a weight loss plan. ?Find a podcast or recording that provides guidance for breathing awareness, body scan, or  meditation exercises. You can listen to these any time when you have a free moment to rest without distractions. ?Follow your treatment plan as told by your health care provider. This may include taking regular medicines and making changes to your diet or lifestyle as recommended. ?Where to find more information ?You can find more information about MBSR from: ?Your health care provider. ?Community-based meditation centers or programs. ?Programs offered near you. ?Summary ?Mindfulness-based stress reduction (MBSR) is a program that teaches you how to consciously pay attention to the present moment. It is used to help you deal better with daily stress, feelings, and pain. ?MBSR focuses on developing self-awareness, which allows you to respond to life stress without judgment or negative feelings. ?MBSR programs may involve learning different mindfulness practices, such as breathing exercises, meditation, yoga, body scan, or mindful eating. Find a mindfulness practice that works best for you, and set aside time for it on a regular basis. ?This information is not intended to replace advice given to you by your health care provider. Make sure you discuss any questions you have with your health care provider. ?Document Revised: 08/16/2020 Document Reviewed: 08/16/2020 ?Elsevier Patient Education ? 2023 Elsevier Inc. ? ? ?

## 2021-05-29 NOTE — Addendum Note (Signed)
Addended by: Aris Georgia, Rogue Pautler L on: 05/29/2021 08:11 AM ? ? Modules accepted: Orders ? ?

## 2021-05-29 NOTE — Telephone Encounter (Signed)
Placed order for referral.

## 2021-06-06 ENCOUNTER — Ambulatory Visit: Payer: Self-pay | Admitting: "Endocrinology

## 2021-06-13 ENCOUNTER — Ambulatory Visit: Payer: 59 | Admitting: Cardiovascular Disease

## 2021-07-18 ENCOUNTER — Ambulatory Visit (INDEPENDENT_AMBULATORY_CARE_PROVIDER_SITE_OTHER): Payer: 59 | Admitting: "Endocrinology

## 2021-07-18 ENCOUNTER — Encounter: Payer: Self-pay | Admitting: "Endocrinology

## 2021-07-18 VITALS — BP 96/52 | HR 60 | Ht 63.0 in | Wt 174.0 lb

## 2021-07-18 DIAGNOSIS — F17200 Nicotine dependence, unspecified, uncomplicated: Secondary | ICD-10-CM | POA: Diagnosis not present

## 2021-07-18 DIAGNOSIS — I2510 Atherosclerotic heart disease of native coronary artery without angina pectoris: Secondary | ICD-10-CM

## 2021-07-18 DIAGNOSIS — E782 Mixed hyperlipidemia: Secondary | ICD-10-CM | POA: Diagnosis not present

## 2021-07-18 DIAGNOSIS — E559 Vitamin D deficiency, unspecified: Secondary | ICD-10-CM | POA: Diagnosis not present

## 2021-07-18 DIAGNOSIS — Z6830 Body mass index (BMI) 30.0-30.9, adult: Secondary | ICD-10-CM

## 2021-07-18 DIAGNOSIS — R946 Abnormal results of thyroid function studies: Secondary | ICD-10-CM | POA: Diagnosis not present

## 2021-07-18 DIAGNOSIS — E6609 Other obesity due to excess calories: Secondary | ICD-10-CM

## 2021-07-18 HISTORY — DX: Atherosclerotic heart disease of native coronary artery without angina pectoris: I25.10

## 2021-07-18 MED ORDER — VITAMIN D (ERGOCALCIFEROL) 1.25 MG (50000 UNIT) PO CAPS: 50000.0000 [IU] | ORAL_CAPSULE | ORAL | 0 refills | Status: AC

## 2021-07-18 NOTE — Progress Notes (Signed)
Endocrinology Consult Note                                            07/18/2021, 6:58 PM   Subjective:    Patient ID: Sheri Vargas, female    DOB: 1975/02/13, PCP Benita Stabile, MD   Past Medical History:  Diagnosis Date   Anxiety    Chronic back pain 06/17/2012   Decreased libido 06/17/2012   Degenerative disc disease    Depression    Ectopic pregnancy, tubal    5 ectopic pregnancies   Hypothyroid 06/25/2012   TSH 5.561, will rx synthroid 25 mcg and recheck in 8 weeks   Kidney stones    Menorrhagia 06/17/2012   Thyroid disease    Past Surgical History:  Procedure Laterality Date   BACK SURGERY     cyst removed   CHOLECYSTECTOMY  2004   ECTOPIC PREGNANCY SURGERY     LEFT HEART CATH AND CORONARY ANGIOGRAPHY N/A 01/11/2021   Procedure: LEFT HEART CATH AND CORONARY ANGIOGRAPHY;  Surgeon: Lyn Records, MD;  Location: [facility];  Service: Cardiovascular;  Laterality: N/A;   Social History   Socioeconomic History   Marital status: Married    Spouse name: Not on file   Number of children: 3   Years of education: Not on file   Highest education level: Not on file  Occupational History   Not on file  Tobacco Use   Smoking status: Every Day    Packs/day: 1.00    Years: 20.00    Total pack years: 20.00    Types: Cigarettes    Last attempt to quit: 01/20/1994    Years since quitting: 27.5   Smokeless tobacco: Never  Vaping Use   Vaping Use: Never used  Substance and Sexual Activity   Alcohol use: No   Drug use: No   Sexual activity: Not Currently    Birth control/protection: None, Abstinence  Other Topics Concern   Not on file  Social History Narrative   Not on file   Social Determinants of Health   Financial Resource Strain: High Risk (12/24/2020)   Overall Financial Resource Strain (CARDIA)    Difficulty of Paying Living Expenses: Very hard  Food Insecurity: No Food Insecurity (12/24/2020)   Hunger Vital Sign    Worried About Running Out  of Food in the Last Year: Never true    Ran Out of Food in the Last Year: Never true  Transportation Needs: Unmet Transportation Needs (12/24/2020)   PRAPARE - Transportation    Lack of Transportation (Medical): Yes    Lack of Transportation (Non-Medical): Yes  Physical Activity: Insufficiently Active (12/24/2020)   Exercise Vital Sign    Days of Exercise per Week: 2 days    Minutes of Exercise per Session: 10 min  Stress: Stress Concern Present (12/24/2020)   [employer] of Occupational Health - Occupational Stress Questionnaire    Feeling of Stress : Very much  Social Connections: Moderately Integrated (12/24/2020)   Social Connection and Isolation Panel [NHANES]    Frequency of Communication with Friends and Family: More than three times a week    Frequency of Social Gatherings with Friends and Family: Once a week    Attends Religious Services: 1 to 4 times per year    Active Member of Clubs or Organizations: No  Attends Banker Meetings: Never    Marital Status: Married   Family History  Problem Relation Age of Onset   Diabetes Paternal Grandmother    Hypertension Father    Kidney Stones Brother    Hypertension Son    Hypertension Paternal Uncle    Hypertension Paternal Uncle    Outpatient Encounter Medications as of 07/18/2021  Medication Sig   CASCARA SAGRADA PO Take 1 capsule by mouth daily.   Cholecalciferol (VITAMIN D3 PO) Take 1 tablet by mouth daily.   Lactobacillus (PROBIOTIC ACIDOPHILUS PO) Take 1 tablet by mouth daily.   MAGNESIUM OXIDE PO Take 1 tablet by mouth daily.   POTASSIUM PO Take 2 tablets by mouth daily.   Vitamin D, Ergocalciferol, (DRISDOL) 1.25 MG (50000 UNIT) CAPS capsule Take 1 capsule (50,000 Units total) by mouth every 7 (seven) days.   ALPRAZolam (XANAX) 0.5 MG tablet Take 0.5 mg by mouth 3 (three) times daily as needed for anxiety.   aspirin EC 81 MG EC tablet Take 1 tablet (81 mg total) by mouth daily. Swallow whole.    DULoxetine (CYMBALTA) 60 MG capsule Take 60 mg by mouth 2 (two) times daily.   fluticasone (FLONASE) 50 MCG/ACT nasal spray Place 1 spray into both nostrils daily.   levothyroxine (SYNTHROID) 50 MCG tablet Take 50 mcg by mouth daily. (Patient not taking: Reported on 07/18/2021)   metoprolol succinate (TOPROL XL) 25 MG 24 hr tablet Take 1 tablet (25 mg total) by mouth daily. Take with or immediately following a meal. (Patient not taking: Reported on 07/18/2021)   nicotine polacrilex (NICORETTE) 2 MG gum Take 1 each (2 mg total) by mouth as needed for smoking cessation.   nitroGLYCERIN (NITROSTAT) 0.4 MG SL tablet Place 1 tablet (0.4 mg total) under the tongue every 5 (five) minutes as needed for chest pain.   norethindrone (AYGESTIN) 5 MG tablet Take 1 tablet (5 mg total) by mouth daily. (Patient not taking: Reported on 07/18/2021)   oxyCODONE-acetaminophen (PERCOCET) 10-325 MG tablet Take 1 tablet by mouth 4 (four) times daily as needed.   rosuvastatin (CRESTOR) 20 MG tablet Take 1 tablet (20 mg total) by mouth daily. (Patient not taking: Reported on 07/18/2021)   [DISCONTINUED] Aspirin-Acetaminophen (GOODYS BODY PAIN PO) Take 1 packet by mouth 2 (two) times daily as needed (headache pain).   [DISCONTINUED] citalopram (CELEXA) 10 MG tablet Take 10 mg by mouth daily.   [DISCONTINUED] sertraline (ZOLOFT) 25 MG tablet Take 25 mg by mouth daily. (Patient not taking: Reported on 05/08/2021)   [DISCONTINUED] sertraline (ZOLOFT) 50 MG tablet Take 100 mg by mouth daily. (Patient not taking: Reported on 05/28/2021)   No facility-administered encounter medications on file as of 07/18/2021.   ALLERGIES: Allergies  Allergen Reactions   Latex     VACCINATION STATUS:  There is no immunization history on file for this patient.  HPI Sheri Vargas is 46 y.o. female who presents today with a medical history as above. she is being seen in consultation for hypothyroidism requested by Benita Stabile, MD.   History  is obtained directly from the patient and chart review.  She reports approximately a year ago, she was diagnosed with underactive thyroid for which she was given levothyroxine which she did not tolerate.  She has not taking any thyroid hormone since February 2022.  Her thyroid function test in April were consistent with euthyroid state.  She is not currently taking any thyroid nor antithyroid intervention. She has multiple medical problems  including hyperlipidemia not on regular treatment, coronary artery disease, pain syndrome, hypertension, obesity. She reports fluctuating body weight, no recent major changes. She denies any close family history of thyroid dysfunction or thyroid malignancy.  She denies dysphagia, shortness of breath, nor voice change.   Review of Systems  Constitutional: + Minimally fluctuating body weight,  +fatigue, no subjective hyperthermia, no subjective hypothermia Eyes: no blurry vision, no xerophthalmia ENT: no sore throat, no nodules palpated in throat, no dysphagia/odynophagia, no hoarseness Cardiovascular: no Chest Pain, no Shortness of Breath, no palpitations, no leg swelling Respiratory: no cough, no shortness of breath Gastrointestinal: no Nausea/Vomiting/Diarhhea Musculoskeletal: no muscle/joint aches Skin: no rashes Neurological: no tremors, no numbness, no tingling, no dizziness Psychiatric: no depression, no anxiety  Objective:       07/18/2021    1:58 PM 05/28/2021   10:37 AM 05/09/2021    2:09 AM  Vitals with BMI  Height 5\' 3"  5\' 3"    Weight 174 lbs 178 lbs   BMI 30.83 31.54   Systolic 96 110 105  Diastolic 52 80 82  Pulse 60 58 62    BP (!) 96/52   Pulse 60   Ht 5\' 3"  (1.6 m)   Wt 174 lb (78.9 kg)   BMI 30.82 kg/m   Wt Readings from Last 3 Encounters:  07/18/21 174 lb (78.9 kg)  05/28/21 178 lb (80.7 kg)  03/22/21 180 lb (81.6 kg)    Physical Exam  Constitutional:  Body mass index is 30.82 kg/m.,  not in acute distress, normal state  of mind Eyes: PERRLA, EOMI, no exophthalmos ENT: moist mucous membranes, no gross thyromegaly, no gross cervical lymphadenopathy Cardiovascular: normal precordial activity, Regular Rate and Rhythm, no Murmur/Rubs/Gallops Respiratory:  adequate breathing efforts, no gross chest deformity, Clear to auscultation bilaterally Gastrointestinal: abdomen soft, Non -tender, No distension, Bowel Sounds present, no gross organomegaly Musculoskeletal: no gross deformities, strength intact in all four extremities Skin: moist, warm, no rashes Neurological: no tremor with outstretched hands, Deep tendon reflexes normal in bilateral lower extremities.  CMP ( most recent) CMP     Component Value Date/Time   NA 137 05/08/2021 2207   K 3.8 05/08/2021 2207   CL 108 05/08/2021 2207   CO2 20 (L) 05/08/2021 2207   GLUCOSE 111 (H) 05/08/2021 2207   BUN 13 05/15/2021 0000   CREATININE 0.6 05/15/2021 0000   CREATININE 0.72 05/08/2021 2207   CREATININE 0.60 06/17/2012 1032   CALCIUM 9.4 05/08/2021 2207   PROT 6.5 03/22/2021 0915   ALBUMIN 4.2 03/22/2021 0915   AST 17 03/22/2021 0915   ALT 16 03/22/2021 0915   ALKPHOS 55 03/22/2021 0915   BILITOT 0.2 03/22/2021 0915   GFRNONAA >60 05/08/2021 2207   GFRAA >60 10/08/2014 0638     Diabetic Labs (most recent): Lab Results  Component Value Date   HGBA1C 5.4 01/11/2021     Lipid Panel ( most recent) Lipid Panel     Component Value Date/Time   CHOL 173 05/15/2021 0000   CHOL 115 03/22/2021 0915   TRIG 99 05/15/2021 0000   HDL 42 05/15/2021 0000   HDL 40 03/22/2021 0915   CHOLHDL 2.9 03/22/2021 0915   CHOLHDL 3.7 01/11/2021 0342   VLDL 14 01/11/2021 0342   LDLCALC 113 05/15/2021 0000   LDLCALC 59 03/22/2021 0915   LABVLDL 16 03/22/2021 0915      Lab Results  Component Value Date   TSH 2.69 05/15/2021   TSH 0.675 01/11/2021   TSH  4.850 (H) 08/10/2012   TSH 5.561 (H) 06/17/2012   FREET4 1.06 08/10/2012     Assessment & Plan:   1.  Mixed hyperlipidemia 2. Abnormal thyroid function test 3. Current smoker 4. Vitamin D deficiency 5. Class 1 obesity due to excess calories with serious comorbidity and body mass index (BMI) of 30.0 to 30.9 in adult 6. Atherosclerosis of native coronary artery without angina pectoris, unspecified whether native or transplanted heart   - Sheri Vargas  is being seen at a kind request of Margo Aye, Kathleene Hazel, MD. - I have reviewed her available thyroid records and clinically evaluated the patient. - Based on these reviews, she has euthyroid presentation up until April 2023.  However she does not have recent labs sufficient for definitive treatment plan.  - she will need a repeat,  more complete thyroid function test towards confirming the diagnosis.-She will be sent to lab today.  I advised her to stay off of levothyroxine until next visit.  I had a long discussion with her about her other serious comorbidities including smoking, hyperlipidemia, vitamin D deficiency, obesity. She is advised to resume and continue Crestor 20 mg nightly.  Side effects and precautions discussed with her.  I discussed initiated vitamin D2 50,000 units weekly for the next 12 weeks.    In light of her history of coronary artery disease, she will benefit from tight control of lipids, weight management, smoking cessation. The patient was counseled on the dangers of tobacco use, and was advised to quit.  Reviewed strategies to maximize success, including removing cigarettes and smoking materials from environment.  If her labs are suggestive of hypothyroidism, she will be reconsidered for low-dose levothyroxine due to advanced as tolerated.   - she is advised to maintain close follow up with Benita Stabile, MD for primary care needs.   - Time spent with the patient: 50 minutes, of which >50% was spent in  counseling her about her 50 and the rest in obtaining information about her symptoms, reviewing her previous labs/studies  ( including abstractions from other facilities),  evaluations, and treatments,  and developing a plan to confirm diagnosis and long term treatment based on the latest standards of care/guidelines; and documenting her care. Risk reduction counseling performed per USPSTF guidelines to reduce obesity and cardiovascular risk factors.    Sheri Vargas participated in the discussions, expressed understanding, and voiced agreement with the above plans.  All questions were answered to her satisfaction. she is encouraged to contact clinic should she have any questions or concerns prior to her return visit.  Follow up plan: Return in about 10 days (around 07/28/2021) for Labs Today- Non-Fasting Ok.   Marquis Lunch, MD [facility] [address] [place], [place] [address] Phone: [phone]  Fax: [phone]     07/18/2021, 6:58 PM  This note was partially dictated with voice recognition software. Similar sounding words can be transcribed inadequately or may not  be corrected upon review.

## 2021-08-02 LAB — TSH: TSH: 6.7 u[IU]/mL — ABNORMAL HIGH (ref 0.450–4.500)

## 2021-08-02 LAB — T3, FREE: T3, Free: 4 pg/mL (ref 2.0–4.4)

## 2021-08-02 LAB — THYROGLOBULIN ANTIBODY: Thyroglobulin Antibody: <1 IU/mL (ref 0.0–0.9)

## 2021-08-02 LAB — THYROID PEROXIDASE ANTIBODY: Thyroperoxidase Ab SerPl-aCnc: 30 IU/mL (ref 0–34)

## 2021-08-02 LAB — T4, FREE: Free T4: 1.39 ng/dL (ref 0.82–1.77)

## 2021-08-07 ENCOUNTER — Encounter: Payer: Self-pay | Admitting: "Endocrinology

## 2021-08-07 ENCOUNTER — Ambulatory Visit (INDEPENDENT_AMBULATORY_CARE_PROVIDER_SITE_OTHER): Payer: 59 | Admitting: "Endocrinology

## 2021-08-07 VITALS — BP 100/78 | HR 88 | Ht 63.0 in | Wt 170.0 lb

## 2021-08-07 DIAGNOSIS — E559 Vitamin D deficiency, unspecified: Secondary | ICD-10-CM | POA: Diagnosis not present

## 2021-08-07 DIAGNOSIS — R946 Abnormal results of thyroid function studies: Secondary | ICD-10-CM | POA: Insufficient documentation

## 2021-08-07 DIAGNOSIS — F17200 Nicotine dependence, unspecified, uncomplicated: Secondary | ICD-10-CM | POA: Diagnosis not present

## 2021-08-07 DIAGNOSIS — E782 Mixed hyperlipidemia: Secondary | ICD-10-CM | POA: Diagnosis not present

## 2021-08-07 NOTE — Progress Notes (Signed)
08/07/2021, 6:33 PM   Subjective:    Patient ID: Sheri Vargas, female    DOB: May 15, 1975, PCP Benita Stabile, MD   Past Medical History:  Diagnosis Date   Anxiety    Chronic back pain 06/17/2012   Decreased libido 06/17/2012   Degenerative disc disease    Depression    Ectopic pregnancy, tubal    5 ectopic pregnancies   Hypothyroid 06/25/2012   TSH 5.561, will rx synthroid 25 mcg and recheck in 8 weeks   Kidney stones    Menorrhagia 06/17/2012   Thyroid disease    Past Surgical History:  Procedure Laterality Date   BACK SURGERY     cyst removed   CHOLECYSTECTOMY  2004   ECTOPIC PREGNANCY SURGERY     LEFT HEART CATH AND CORONARY ANGIOGRAPHY N/A 01/11/2021   Procedure: LEFT HEART CATH AND CORONARY ANGIOGRAPHY;  Surgeon: Lyn Records, MD;  Location: [facility];  Service: Cardiovascular;  Laterality: N/A;   Social History   Socioeconomic History   Marital status: Married    Spouse name: Not on file   Number of children: 3   Years of education: Not on file   Highest education level: Not on file  Occupational History   Not on file  Tobacco Use   Smoking status: Every Day    Packs/day: 1.00    Years: 20.00    Total pack years: 20.00    Types: Cigarettes    Last attempt to quit: 01/20/1994    Years since quitting: 27.5   Smokeless tobacco: Never  Vaping Use   Vaping Use: Never used  Substance and Sexual Activity   Alcohol use: No   Drug use: No   Sexual activity: Not Currently    Birth control/protection: None, Abstinence  Other Topics Concern   Not on file  Social History Narrative   Not on file   Social Determinants of Health   Financial Resource Strain: High Risk (12/24/2020)   Overall Financial Resource Strain (CARDIA)    Difficulty of Paying Living Expenses: Very hard  Food Insecurity: No Food Insecurity (12/24/2020)   Hunger Vital Sign    Worried About Running Out of Food in the Last Year:  Never true    Ran Out of Food in the Last Year: Never true  Transportation Needs: Unmet Transportation Needs (12/24/2020)   PRAPARE - Transportation    Lack of Transportation (Medical): Yes    Lack of Transportation (Non-Medical): Yes  Physical Activity: Insufficiently Active (12/24/2020)   Exercise Vital Sign    Days of Exercise per Week: 2 days    Minutes of Exercise per Session: 10 min  Stress: Stress Concern Present (12/24/2020)   [employer] of Occupational Health - Occupational Stress Questionnaire    Feeling of Stress : Very much  Social Connections: Moderately Integrated (12/24/2020)   Social Connection and Isolation Panel [NHANES]    Frequency of Communication with Friends and Family: More than three times a week    Frequency of Social Gatherings with Friends and Family: Once a week    Attends Religious Services: 1 to 4 times per year    Active Member of Clubs or Organizations: No  Attends Banker Meetings: Never    Marital Status: Married   Family History  Problem Relation Age of Onset   Diabetes Paternal Grandmother    Hypertension Father    Kidney Stones Brother    Hypertension Son    Hypertension Paternal Uncle    Hypertension Paternal Uncle    Outpatient Encounter Medications as of 08/07/2021  Medication Sig   rosuvastatin (CRESTOR) 20 MG tablet Take 1 tablet (20 mg total) by mouth daily.   ALPRAZolam (XANAX) 0.5 MG tablet Take 0.5 mg by mouth 3 (three) times daily as needed for anxiety.   aspirin EC 81 MG EC tablet Take 1 tablet (81 mg total) by mouth daily. Swallow whole.   CASCARA SAGRADA PO Take 1 capsule by mouth daily.   DULoxetine (CYMBALTA) 60 MG capsule Take 60 mg by mouth 2 (two) times daily.   fluticasone (FLONASE) 50 MCG/ACT nasal spray Place 1 spray into both nostrils daily.   Lactobacillus (PROBIOTIC ACIDOPHILUS PO) Take 1 tablet by mouth daily.   MAGNESIUM OXIDE PO Take 1 tablet by mouth daily.   nicotine polacrilex (NICORETTE) 2  MG gum Take 1 each (2 mg total) by mouth as needed for smoking cessation.   nitroGLYCERIN (NITROSTAT) 0.4 MG SL tablet Place 1 tablet (0.4 mg total) under the tongue every 5 (five) minutes as needed for chest pain.   oxyCODONE-acetaminophen (PERCOCET) 10-325 MG tablet Take 1 tablet by mouth 4 (four) times daily as needed.   POTASSIUM PO Take 2 tablets by mouth daily.   Vitamin D, Ergocalciferol, (DRISDOL) 1.25 MG (50000 UNIT) CAPS capsule Take 1 capsule (50,000 Units total) by mouth every 7 (seven) days.   [DISCONTINUED] Cholecalciferol (VITAMIN D3 PO) Take 1 tablet by mouth daily.   [DISCONTINUED] citalopram (CELEXA) 10 MG tablet Take 10 mg by mouth daily.   [DISCONTINUED] metoprolol succinate (TOPROL XL) 25 MG 24 hr tablet Take 1 tablet (25 mg total) by mouth daily. Take with or immediately following a meal. (Patient not taking: Reported on 07/18/2021)   [DISCONTINUED] norethindrone (AYGESTIN) 5 MG tablet Take 1 tablet (5 mg total) by mouth daily. (Patient not taking: Reported on 07/18/2021)   No facility-administered encounter medications on file as of 08/07/2021.   ALLERGIES: Allergies  Allergen Reactions   Latex     VACCINATION STATUS:  There is no immunization history on file for this patient.  HPI Sheri Vargas is 46 y.o. female who presents today with a medical history as above. she is being seen in follow-up after she was seen in consultation for hypothyroidism requested by Benita Stabile, MD.   -See notes from her first visit.  She reports approximately a year ago, she was diagnosed with underactive thyroid for which she was given levothyroxine which she did not tolerate.  She has not taking any thyroid hormone since February 2022.  Her thyroid function test in April were consistent with euthyroid state.   Her previsit thyroid function tests from June remain consistent with euthyroid state, a separate set of labs from July showed high TSH of 6.7, however free T3 of 4.0, free T4 4  of 1.39.  She is not currently on thyroid hormone nor antithyroid intervention She has multiple medical problems including hyperlipidemia, resume her treatment since last visit.   She remains a smoker, she has history of coronary artery disease, pain syndrome, hypertension, obesity. She reports fluctuating body weight, no recent major changes. She denies any close family history of thyroid dysfunction or thyroid  malignancy.  She denies dysphagia, shortness of breath, nor voice change.   Review of Systems  Constitutional: + Minimally fluctuating body weight,  +fatigue, no subjective hyperthermia, no subjective hypothermia Eyes: no blurry vision, no xerophthalmia ENT: no sore throat, no nodules palpated in throat, no dysphagia/odynophagia, no hoarseness Cardiovascular: no Chest Pain, no Shortness of Breath, no palpitations, no leg swelling Respiratory: no cough, no shortness of breath Gastrointestinal: no Nausea/Vomiting/Diarhhea Musculoskeletal: no muscle/joint aches Skin: no rashes Neurological: no tremors, no numbness, no tingling, no dizziness Psychiatric: no depression, no anxiety  Objective:       08/07/2021    3:31 PM 07/18/2021    1:58 PM 05/28/2021   10:37 AM  Vitals with BMI  Height 5\' 3"  5\' 3"  5\' 3"   Weight 170 lbs 174 lbs 178 lbs  BMI 30.12 30.83 31.54  Systolic 100 96 110  Diastolic 78 52 80  Pulse 88 60 58    BP 100/78   Pulse 88   Ht 5\' 3"  (1.6 m)   Wt 170 lb (77.1 kg)   BMI 30.11 kg/m   Wt Readings from Last 3 Encounters:  08/07/21 170 lb (77.1 kg)  07/18/21 174 lb (78.9 kg)  05/28/21 178 lb (80.7 kg)    Physical Exam  Constitutional:  Body mass index is 30.11 kg/m.,  not in acute distress, normal state of mind Eyes: PERRLA, EOMI, no exophthalmos ENT: moist mucous membranes, no gross thyromegaly, no gross cervical lymphadenopathy Cardiovascular: normal precordial activity, Regular Rate and Rhythm, no Murmur/Rubs/Gallops.   CMP ( most recent) CMP      Component Value Date/Time   NA 137 05/08/2021 2207   K 3.8 05/08/2021 2207   CL 108 05/08/2021 2207   CO2 20 (L) 05/08/2021 2207   GLUCOSE 111 (H) 05/08/2021 2207   BUN 13 05/15/2021 0000   CREATININE 0.6 05/15/2021 0000   CREATININE 0.72 05/08/2021 2207   CREATININE 0.60 06/17/2012 1032   CALCIUM 9.4 05/08/2021 2207   PROT 6.5 03/22/2021 0915   ALBUMIN 4.2 03/22/2021 0915   AST 17 03/22/2021 0915   ALT 16 03/22/2021 0915   ALKPHOS 55 03/22/2021 0915   BILITOT 0.2 03/22/2021 0915   GFRNONAA >60 05/08/2021 2207   GFRAA >60 10/08/2014 0638     Diabetic Labs (most recent): Lab Results  Component Value Date   HGBA1C 5.4 01/11/2021     Lipid Panel ( most recent) Lipid Panel     Component Value Date/Time   CHOL 173 05/15/2021 0000   CHOL 115 03/22/2021 0915   TRIG 99 05/15/2021 0000   HDL 42 05/15/2021 0000   HDL 40 03/22/2021 0915   CHOLHDL 2.9 03/22/2021 0915   CHOLHDL 3.7 01/11/2021 0342   VLDL 14 01/11/2021 0342   LDLCALC 113 05/15/2021 0000   LDLCALC 59 03/22/2021 0915   LABVLDL 16 03/22/2021 0915      Lab Results  Component Value Date   TSH 6.700 (H) 08/01/2021   TSH 2.69 05/15/2021   TSH 0.675 01/11/2021   TSH 4.850 (H) 08/10/2012   TSH 5.561 (H) 06/17/2012   FREET4 1.39 08/01/2021   FREET4 1.06 08/10/2012     Assessment & Plan:   1. Mixed hyperlipidemia 2. Abnormal thyroid function test 3. Current smoker 4. Vitamin D deficiency 5. Class 1 obesity due to excess calories with serious comorbidity and body mass index (BMI) of 30.0 to 30.9 in adult 6. Atherosclerosis of native coronary artery without angina pectoris, unspecified whether native or transplanted heart  Her thyroid function tests are consistent with euthyroid presentation, will not be initiated on thyroid hormone or antithyroid intervention at this time. She will need repeat thyroid function test in 6 months. I had a long discussion with her about her other serious comorbidities  including smoking, hyperlipidemia, vitamin D deficiency, obesity. She is advised to continue Crestor 20 mg p.o. nightly.    Side effects and precautions discussed with her.  I discussed initiated vitamin D2 50,000 units weekly for the next 12 weeks.    In light of her history of coronary artery disease, she will benefit from tight control of lipids, weight management, smoking cessation. The patient was counseled on the dangers of tobacco use, and was advised to quit.  Reviewed strategies to maximize success, including removing cigarettes and smoking materials from environment.    - she is advised to maintain close follow up with Benita Stabile, MD for primary care needs.  I spent 25 minutes in the care of the patient today including review of labs from Thyroid Function, CMP, and other relevant labs ; imaging/biopsy records (current and previous including abstractions from other facilities); face-to-face time discussing  her lab results and symptoms, medications doses, her options of short and long term treatment based on the latest standards of care / guidelines;   and documenting the encounter. Risk reduction counseling performed per USPSTF guidelines to reduce obesity and cardiovascular risk factors.    Sheri Vargas  participated in the discussions, expressed understanding, and voiced agreement with the above plans.  All questions were answered to her satisfaction. she is encouraged to contact clinic should she have any questions or concerns prior to her return visit.   Follow up plan: Return in about 6 months (around 02/07/2022) for F/U with Pre-visit Labs.   Marquis Lunch, MD [facility] [address] [place], [place] [address] Phone: [phone]  Fax: [phone]     08/07/2021, 6:33 PM  This note was partially dictated with voice recognition software. Similar sounding words can be transcribed inadequately or may not  be  corrected upon review.

## 2022-02-10 ENCOUNTER — Telehealth: Payer: Self-pay | Admitting: "Endocrinology

## 2022-02-10 DIAGNOSIS — E559 Vitamin D deficiency, unspecified: Secondary | ICD-10-CM

## 2022-02-10 DIAGNOSIS — E782 Mixed hyperlipidemia: Secondary | ICD-10-CM

## 2022-02-10 DIAGNOSIS — R946 Abnormal results of thyroid function studies: Secondary | ICD-10-CM

## 2022-02-10 DIAGNOSIS — E6609 Other obesity due to excess calories: Secondary | ICD-10-CM

## 2022-02-10 NOTE — Telephone Encounter (Signed)
New order in chart 

## 2022-02-10 NOTE — Telephone Encounter (Signed)
Will you please update pts lab orders for [facility]

## 2022-02-11 ENCOUNTER — Ambulatory Visit: Payer: 59 | Admitting: "Endocrinology

## 2022-04-14 ENCOUNTER — Other Ambulatory Visit (HOSPITAL_COMMUNITY): Payer: Self-pay | Admitting: Family Medicine

## 2022-04-14 DIAGNOSIS — Z1231 Encounter for screening mammogram for malignant neoplasm of breast: Secondary | ICD-10-CM

## 2022-07-08 ENCOUNTER — Telehealth: Payer: Self-pay | Admitting: Cardiovascular Disease

## 2022-07-08 MED ORDER — METOPROLOL SUCCINATE ER 25 MG PO TB24: 25.0000 mg | ORAL_TABLET | Freq: Every day | ORAL | 3 refills | Status: AC

## 2022-07-08 NOTE — Telephone Encounter (Signed)
Pt c/o medication issue:  1. Name of Medication:   ALPRAZolam (XANAX) 0.5 MG tablet   2. How are you currently taking this medication (dosage and times per day)?   As prescribed  3. Are you having a reaction (difficulty breathing--STAT)?   4. What is your medication issue?   Patient stated she has been taken off this medication and she has started taking her heart medication again but is having extreme anxiety.  Patient wants call back to discuss alternate medication.

## 2022-07-08 NOTE — Telephone Encounter (Signed)
Spoke with pt, Sheri Vargas is the person that stopped the xanax. Aware she can take xanax it is fine with all of her medication. She is aware we can not give her anything for her anxiety. Follow up scheduled per recall with jesse cleaver np. She feels she may need extra metoprolol since she is no longer taking xanax.

## 2022-08-20 NOTE — Progress Notes (Deleted)
  Cardiology Office Note:  .   Date:  08/20/2022  ID:  Sheri Vargas, DOB 12-05-1975, MRN [id number] PCP: Benita Stabile, MD  [facility] Providers Cardiologist:  Nanetta Batty, MD { Click to update primary MD,subspecialty MD or APP then REFRESH:1}   History of Present Illness: .   Sheri Vargas is a 47 y.o. female with past medical history of hyperlipidemia, coronary artery disease, tobacco abuse, anxiety, infective colitis and hypothyroidism.  She presents today for follow-up regarding CAD and hyperlipidemia.  In 12/2020 she was hospitalized with NSTEMI, her troponins elevated to 1000.  She underwent cardiac catheterization on 01/11/2021 by Dr. Katrinka Blazing.  She was found to have minimal plaque in her LAD.  She was also noted to have anterior lateral/apicla wall motion abnormality.  Her EF was normal but she was noted to have wall motion abnormalities.  A cardiac MRI 03/19/2021 showed normal function.  During her follow-up evaluation she denied recurrent symptoms.  She was last seen in office on 05/28/2021.  She reported ongoing intermittent periods of left neck, shoulder and chest discomfort.  She had self reduced her Toprol to 25 mg due to fatigue. Her blood pressure at visit was 110/80 with a pulse of 58, she was continued on 25 mg of Toprol.  She was referred to neurology for what appeared to be neuropathic pain, with recommended follow-up in 9 to 12 months.  Coronary artery disease: On 01/11/2021 she underwent cardiac catheterization for NSTEMI.  She was noted to have minimal nonobstructive CAD.  She was noted to have wall motion abnormalities on her echocardiogram, consistent with potential "Takotsubo syndrome" follow-up cardiac MRI showed LVEF of 66% and normal function.  Continue aspirin, metoprolol and rosuvastatin. Angina?  Hyperlipidemia:   Last lipid profile on 05/15/21 indicated total cholesterol of 173 and LDL of 113. Continue aspirin and rosuvastatin Tobacco abuse: Currently  smoking Left neck pain:  Neurology follow up?   ROS: ***  Studies Reviewed: .        *** Risk Assessment/Calculations:   {Does this patient have ATRIAL FIBRILLATION?[phone]} No BP recorded.  {Refresh Note OR Click here to enter BP  :1}***       Physical Exam:   VS:  There were no vitals taken for this visit.   Wt Readings from Last 3 Encounters:  08/07/21 170 lb (77.1 kg)  07/18/21 174 lb (78.9 kg)  05/28/21 178 lb (80.7 kg)    GEN: Well nourished, well developed in no acute distress NECK: No JVD; No carotid bruits CARDIAC: ***RRR, no murmurs, rubs, gallops RESPIRATORY:  Clear to auscultation without rales, wheezing or rhonchi  ABDOMEN: Soft, non-tender, non-distended EXTREMITIES:  No edema; No deformity   ASSESSMENT AND PLAN: .   ***    {Are you ordering a CV Procedure (e.g. stress test, cath, DCCV, TEE, etc)?   Press F2        :[id number]}  Dispo: ***  Signed, Rip Harbour, NP

## 2022-08-21 ENCOUNTER — Ambulatory Visit: Payer: Self-pay | Attending: General Practice | Admitting: General Practice

## 2022-08-21 ENCOUNTER — Encounter: Payer: Self-pay | Admitting: General Practice

## 2023-02-20 ENCOUNTER — Other Ambulatory Visit (HOSPITAL_COMMUNITY): Payer: Self-pay | Admitting: Nurse Practitioner

## 2023-02-20 DIAGNOSIS — Z1231 Encounter for screening mammogram for malignant neoplasm of breast: Secondary | ICD-10-CM

## 2023-04-15 ENCOUNTER — Encounter (HOSPITAL_COMMUNITY): Payer: Self-pay

## 2023-04-15 ENCOUNTER — Other Ambulatory Visit: Payer: Self-pay

## 2023-04-15 ENCOUNTER — Emergency Department (HOSPITAL_COMMUNITY)
Admission: EM | Admit: 2023-04-15 | Discharge: 2023-04-15 | Disposition: A | Discharge status: Home / Self Care | Attending: Emergency Medicine | Admitting: Emergency Medicine

## 2023-04-15 ENCOUNTER — Emergency Department (HOSPITAL_COMMUNITY)

## 2023-04-15 DIAGNOSIS — R079 Chest pain, unspecified: Secondary | ICD-10-CM

## 2023-04-15 DIAGNOSIS — E039 Hypothyroidism, unspecified: Secondary | ICD-10-CM | POA: Diagnosis not present

## 2023-04-15 DIAGNOSIS — F17210 Nicotine dependence, cigarettes, uncomplicated: Secondary | ICD-10-CM | POA: Diagnosis not present

## 2023-04-15 HISTORY — DX: Acute myocardial infarction, unspecified: I21.9

## 2023-04-15 LAB — CBC
HCT: 40.6 % (ref 36.0–46.0)
Hemoglobin: 13.9 g/dL (ref 12.0–15.0)
MCH: 31 pg (ref 26.0–34.0)
MCHC: 34.2 g/dL (ref 30.0–36.0)
MCV: 90.4 fL (ref 80.0–100.0)
Platelets: 457 10*3/uL — ABNORMAL HIGH (ref 150–400)
RBC: 4.49 MIL/uL (ref 3.87–5.11)
RDW: 14.4 % (ref 11.5–15.5)
WBC: 10.1 10*3/uL (ref 4.0–10.5)
nRBC: 0 % (ref 0.0–0.2)

## 2023-04-15 LAB — BASIC METABOLIC PANEL
Anion gap: 14 (ref 5–15)
BUN: 13 mg/dL (ref 6–20)
CO2: 20 mmol/L — ABNORMAL LOW (ref 22–32)
Calcium: 9.4 mg/dL (ref 8.9–10.3)
Chloride: 104 mmol/L (ref 98–111)
Creatinine, Ser: 0.71 mg/dL (ref 0.44–1.00)
GFR, Estimated: >60 mL/min (ref >60)
Glucose, Bld: 109 mg/dL — ABNORMAL HIGH (ref 70–99)
Potassium: 3.7 mmol/L (ref 3.5–5.1)
Sodium: 138 mmol/L (ref 135–145)

## 2023-04-15 LAB — TROPONIN I (HIGH SENSITIVITY)
Troponin I (High Sensitivity): <2 ng/L (ref <18)
Troponin I (High Sensitivity): <2 ng/L (ref <18)

## 2023-04-15 NOTE — ED Provider Notes (Signed)
 [facility]-EMERGENCY DEPT [facility] Emergency Department Provider Note MRN:  [id number]  Arrival date & time: 04/15/23     Chief Complaint   Chest Pain   History of Present Illness   Sheri Vargas is a 48 y.o. year-old female with a history of MI presenting to the ED with chief complaint of chest pain.  Dull aching chest pain or a few hours today, felt anxious at the time.  Poor sleep recently.  Does not feel similar to patient's prior heart attack.  Wondering if it is anxiety related.  No symptoms at this time.  Denies recent fever or cough, no leg pain or swelling.  Review of Systems  A thorough review of systems was obtained and all systems are negative except as noted in the HPI and PMH.   Patient's Health History    Past Medical History:  Diagnosis Date   Anxiety    Chronic back pain 06/17/2012   Decreased libido 06/17/2012   Degenerative disc disease    Depression    Ectopic pregnancy, tubal    5 ectopic pregnancies   Hypothyroid 06/25/2012   TSH 5.561, will rx synthroid 25 mcg and recheck in 8 weeks   Kidney stones    Menorrhagia 06/17/2012   MI (myocardial infarction) [facility])    Thyroid disease     Past Surgical History:  Procedure Laterality Date   BACK SURGERY     cyst removed   CHOLECYSTECTOMY  2004   ECTOPIC PREGNANCY SURGERY     LEFT HEART CATH AND CORONARY ANGIOGRAPHY N/A 01/11/2021   Procedure: LEFT HEART CATH AND CORONARY ANGIOGRAPHY;  Surgeon: Lyn Records, MD;  Location: [facility];  Service: Cardiovascular;  Laterality: N/A;    Family History  Problem Relation Age of Onset   Diabetes Paternal Grandmother    Hypertension Father    Kidney Stones Brother    Hypertension Son    Hypertension Paternal Uncle    Hypertension Paternal Uncle     Social History   Socioeconomic History   Marital status: Married    Spouse name: Not on file   Number of children: 3   Years of education: Not on file   Highest education level: Not on file   Occupational History   Not on file  Tobacco Use   Smoking status: Every Day    Current packs/day: 0.00    Types: Cigarettes    Last attempt to quit: 01/20/1994    Years since quitting: 29.2   Smokeless tobacco: Never  Vaping Use   Vaping status: Never Used  Substance and Sexual Activity   Alcohol use: No   Drug use: No   Sexual activity: Not Currently    Birth control/protection: None, Abstinence  Other Topics Concern   Not on file  Social History Narrative   Not on file   Social Drivers of Health   Financial Resource Strain: High Risk (12/24/2020)   Overall Financial Resource Strain (CARDIA)    Difficulty of Paying Living Expenses: Very hard  Food Insecurity: No Food Insecurity (12/24/2020)   Hunger Vital Sign    Worried About Running Out of Food in the Last Year: Never true    Ran Out of Food in the Last Year: Never true  Transportation Needs: Unmet Transportation Needs (12/24/2020)   PRAPARE - Transportation    Lack of Transportation (Medical): Yes    Lack of Transportation (Non-Medical): Yes  Physical Activity: Insufficiently Active (12/24/2020)   Exercise Vital Sign  Days of Exercise per Week: 2 days    Minutes of Exercise per Session: 10 min  Stress: Stress Concern Present (12/24/2020)   [employer] of Occupational Health - Occupational Stress Questionnaire    Feeling of Stress : Very much  Social Connections: Moderately Integrated (12/24/2020)   Social Connection and Isolation Panel [NHANES]    Frequency of Communication with Friends and Family: More than three times a week    Frequency of Social Gatherings with Friends and Family: Once a week    Attends Religious Services: 1 to 4 times per year    Active Member of [employer] or Organizations: No    Attends Banker Meetings: Never    Marital Status: Married  Catering manager Violence: Unknown (12/24/2020)   Humiliation, Afraid, Rape, and Kick questionnaire    Fear of Current or Ex-Partner: Patient  declined    Emotionally Abused: Patient declined    Physically Abused: No    Sexually Abused: No     Physical Exam   Vitals:   04/15/23 2248 04/15/23 2300  BP: 109/63 108/78  Pulse: 79 77  Resp: 13 17  Temp:    SpO2: 98% 98%    CONSTITUTIONAL: Well-appearing, NAD NEURO/PSYCH:  Alert and oriented x 3, no focal deficits EYES:  eyes equal and reactive ENT/NECK:  no LAD, no JVD CARDIO: Regular rate, well-perfused, normal S1 and S2 PULM:  CTAB no wheezing or rhonchi GI/GU:  non-distended, non-tender MSK/SPINE:  No gross deformities, no edema SKIN:  no rash, atraumatic   *Additional and/or pertinent findings included in MDM below  Diagnostic and Interventional Summary    EKG Interpretation Date/Time:  Wednesday April 15 2023 19:36:12 EDT Ventricular Rate:  96 PR Interval:  154 QRS Duration:  66 QT Interval:  340 QTC Calculation: 429 R Axis:   10  Text Interpretation: Normal sinus rhythm Low voltage QRS Cannot rule out Anterior infarct (cited on or before 15-Apr-2023) Abnormal ECG When compared with ECG of 15-Apr-2023 19:35, No significant change was found Confirmed by Kennis Carina [phone]) on 04/15/2023 11:11:03 PM       Labs Reviewed  BASIC METABOLIC PANEL - Abnormal; Notable for the following components:      Result Value   CO2 20 (*)    Glucose, Bld 109 (*)    All other components within normal limits  CBC - Abnormal; Notable for the following components:   Platelets 457 (*)    All other components within normal limits  POC URINE PREG, ED  TROPONIN I (HIGH SENSITIVITY)  TROPONIN I (HIGH SENSITIVITY)    DG Chest 2 View  Final Result      Medications - No data to display   Procedures  /  Critical Care Procedures  ED Course and Medical Decision Making  Initial Impression and Ddx Atypical chest pain, given patient's history of MI ACS is considered, favoring noncardiac cause such as anxiety.  No symptoms at this time, reassuring vital signs.  Doubt  PE.  Past medical/surgical history that increases complexity of ED encounter: MI  Interpretation of Diagnostics I personally reviewed the EKG and my interpretation is as follows: Sinus rhythm with nonspecific findings  No significant blood count or electrolyte disturbance.  Troponin negative x 2.  Patient Reassessment and Ultimate Disposition/Management     With reassuring evaluation and low concern for cardiac chest pain patient is appropriate for discharge.  She is requesting discharge and feels comfortable following up with her regular doctors.  She is made  aware of the x-ray finding, clinically no concern for pneumonia.  Patient management required discussion with the following services or consulting groups:  None  Complexity of Problems Addressed Acute illness or injury that poses threat of life of bodily function  Additional Data Reviewed and Analyzed Further history obtained from: None  Additional Factors Impacting ED Encounter Risk Consideration of hospitalization  Elmer Sow. Pilar Plate, MD [facility] Emergency Medicine [facility] [email or web address]  Final Clinical Impressions(s) / ED Diagnoses     ICD-10-CM   1. Chest pain, unspecified type  R07.9       ED Discharge Orders     None        Discharge Instructions Discussed with and Provided to Patient:    Discharge Instructions      You were evaluated in the Emergency Department and after careful evaluation, we did not find any emergent condition requiring admission or further testing in the hospital.  Your exam/testing today was overall reassuring.  Recommend repeat x-ray in 4 to 6 weeks.  Discussed this with your primary care doctor.  Please return to the Emergency Department if you experience any worsening of your condition.  Thank you for allowing [place] to be a part of your care.       Sabas Sous, MD 04/15/23 [phone]

## 2023-04-15 NOTE — ED Triage Notes (Addendum)
 Pt reports left sided chest pain that goes into left shoulder, describes pain as "dull ache, not like heart attack did" pt says she feels like her heart is racing. Pt HR is normal. Pt says "this might be anxiety attack, as she has hx of that"

## 2023-04-15 NOTE — ED Notes (Signed)
 Pt in hall wanting dc.

## 2023-04-15 NOTE — Discharge Instructions (Signed)
 You were evaluated in the Emergency Department and after careful evaluation, we did not find any emergent condition requiring admission or further testing in the hospital.  Your exam/testing today was overall reassuring.  Recommend repeat x-ray in 4 to 6 weeks.  Discussed this with your primary care doctor.  Please return to the Emergency Department if you experience any worsening of your condition.  Thank you for allowing [place] to be a part of your care.

## 2023-07-10 NOTE — Therapy (Unsigned)
 OUTPATIENT PHYSICAL THERAPY THORACOLUMBAR EVALUATION   Patient Name: Sheri Vargas MRN: [id number] DOB:1975-01-24, 48 y.o., female Today's Date: 07/10/2023  END OF SESSION:   Past Medical History:  Diagnosis Date   Anxiety    Chronic back pain 06/17/2012   Decreased libido 06/17/2012   Degenerative disc disease    Depression    Ectopic pregnancy, tubal    5 ectopic pregnancies   Hypothyroid 06/25/2012   TSH 5.561, will rx synthroid  25 mcg and recheck in 8 weeks   Kidney stones    Menorrhagia 06/17/2012   MI (myocardial infarction) [facility])    Thyroid  disease    Past Surgical History:  Procedure Laterality Date   BACK SURGERY     cyst removed   CHOLECYSTECTOMY  2004   ECTOPIC PREGNANCY SURGERY     LEFT HEART CATH AND CORONARY ANGIOGRAPHY N/A 01/11/2021   Procedure: LEFT HEART CATH AND CORONARY ANGIOGRAPHY;  Surgeon: Claudene Victory ORN, MD;  Location: [facility];  Service: Cardiovascular;  Laterality: N/A;   Patient Active Problem List   Diagnosis Date Noted   Abnormal thyroid  function test 08/07/2021   Vitamin D  deficiency 07/18/2021   Class 1 obesity due to excess calories with serious comorbidity and body mass index (BMI) of 30.0 to 30.9 in adult 07/18/2021   Atherosclerosis of native coronary artery without angina pectoris 07/18/2021   Mixed hyperlipidemia 03/22/2021   NSTEMI (non-ST elevated myocardial infarction) (HCC) 01/10/2021   Current smoker 01/07/2021   Encounter for gynecological examination with Papanicolaou smear of cervix 12/24/2020   Urinary frequency 12/24/2020   Anxiety and depression 12/24/2020   Weight gain 12/24/2020   Night sweats 12/24/2020   Peri-menopause 12/24/2020   Screening mammogram for breast cancer 12/24/2020   Screening for colorectal cancer 12/24/2020   Encounter for screening fecal occult blood testing 12/24/2020   Nasal septal perforation 05/07/2017   Rhinitis, chronic 05/07/2017   Tobacco dependence 05/07/2017   Tobacco  abuse 10/06/2014   Hypokalemia 10/06/2014   Chronic low back pain 10/06/2014   Infective colitis 10/04/2014   Hypothyroidism 06/25/2012   Anxiety 06/17/2012   Chronic back pain 06/17/2012   Menorrhagia 06/17/2012   Uterine enlargement 06/17/2012   Decreased libido 06/17/2012    PCP: Shona Norleen PEDLAR, MD  REFERRING PROVIDER: Joshua Alm Hamilton, MD  REFERRING DIAG: lumbar spondylolosthesis  Rationale for Evaluation and Treatment: {HABREHAB:27488}  THERAPY DIAG:  No diagnosis found.  ONSET DATE: ***  SUBJECTIVE:  SUBJECTIVE STATEMENT: ***  PERTINENT HISTORY:  ***  PAIN:  Are you having pain? {OPRCPAIN:27236}  PRECAUTIONS: None  RED FLAGS: {PT Red Flags:29287}   WEIGHT BEARING RESTRICTIONS: No  FALLS:  Has patient fallen in last 6 months? {fallsyesno:27318}  LIVING ENVIRONMENT: Lives with: {OPRC lives with:25569::lives with their family} Lives in: {Lives in:25570} Stairs: {opstairs:27293} Has following equipment at home: {Assistive devices:23999}  OCCUPATION: ***  PLOF: {PLOF:24004}  PATIENT GOALS: ***  NEXT MD VISIT: ***  OBJECTIVE:  Note: Objective measures were completed at Evaluation unless otherwise noted.  DIAGNOSTIC FINDINGS:  N/A  PATIENT SURVEYS:  Modified Oswestry: {:PHR,OPRCODI}  COGNITION: Overall cognitive status: {cognition:24006}     SENSATION: {sensation:27233}  MUSCLE LENGTH: Hamstrings: Right *** deg; Left *** deg Debby test: Right *** deg; Left *** deg  POSTURE: {posture:25561}  PALPATION: ***  LUMBAR ROM:   AROM eval  Flexion   Extension   Right lateral flexion   Left lateral flexion   Right rotation   Left rotation    (Blank rows = not tested)  LOWER EXTREMITY ROM:     {AROM/PROM:27142}  Right eval Left eval  Hip flexion     Hip extension    Hip abduction    Hip adduction    Hip internal rotation    Hip external rotation    Knee flexion    Knee extension    Ankle dorsiflexion    Ankle plantarflexion    Ankle inversion    Ankle eversion     (Blank rows = not tested)  LOWER EXTREMITY MMT:    MMT Right eval Left eval  Hip flexion    Hip extension    Hip abduction    Hip adduction    Hip internal rotation    Hip external rotation    Knee flexion    Knee extension    Ankle dorsiflexion    Ankle plantarflexion    Ankle inversion    Ankle eversion     (Blank rows = not tested)  LUMBAR SPECIAL TESTS:  {lumbar special test:25242}  FUNCTIONAL TESTS:  {Functional tests:24029}  GAIT: Distance walked: *** Assistive device utilized: {Assistive devices:23999} Level of assistance: {Levels of assistance:24026} Comments: ***  TREATMENT DATE:  07/13/23: PT eval and HEP                                                                                                                                 PATIENT EDUCATION:  Education details: PT evaluation, objective findings, POC, Importance of HEP, Precautions, Clinic policies  Person educated: Patient Education method: Explanation and Demonstration Education comprehension: verbalized understanding and returned demonstration  HOME EXERCISE PROGRAM: ***  ASSESSMENT:  CLINICAL IMPRESSION: Patient is a 48 y.o. female who was seen today for physical therapy evaluation and treatment for lumbar spondylolosthesis.   OBJECTIVE IMPAIRMENTS: {opptimpairments:25111}.   ACTIVITY LIMITATIONS: {activitylimitations:27494}  PARTICIPATION LIMITATIONS: {participationrestrictions:25113}  PERSONAL FACTORS: {Personal factors:25162} are also affecting patient's functional outcome.  REHAB POTENTIAL: {rehabpotential:25112}  CLINICAL DECISION MAKING: {clinical decision making:25114}  EVALUATION COMPLEXITY: {Evaluation complexity:25115}   GOALS: Goals  reviewed with patient? No  SHORT TERM GOALS: Target date: 07/27/23 Patient will be independent with performance of HEP to demonstrate adequate self management of symptoms.  Baseline:  Goal status: INITIAL  2.   Patient will report at least a 25% improvement with function or pain overall since beginning PT. Baseline:  Goal status: INITIAL   LONG TERM GOALS: Target date: 08/24/23 Patient will improve Oswestry score by     points in order to improve self-perceived disability and overall function.  Baseline: Goal status: INITIAL   2.  Patient will improve     score by     in order to   .  Baseline: Goal status: INITIAL   3.  Patient will improve    test to   in order to improve LE strength and endurance to return to  . Baseline:  Goal status: INITIAL   4.  Patient will gain at least    deg of AROM in     in order to improve foot clearance for safe gait mechanics Baseline:  Goal status: INITIAL   5.  Patient will report overall 50% improvement since beginning PT. Baseline:  Goal status: INITIAL   PLAN:  PT FREQUENCY: 2x/week  PT DURATION: 6 weeks  PLANNED INTERVENTIONS: 97164- PT Re-evaluation, 97110-Therapeutic exercises, 97530- Therapeutic activity, [id number]- Neuromuscular re-education, 97535- Self Care, 02859- Manual therapy, [id number]- Gait training, [phone]- Electrical stimulation (manual), [id number]- Traction (mechanical), [phone] (1-2 muscles), 20561 (3+ muscles)- Dry Needling, Patient/Family education, Balance training, Stair training, Taping, Joint mobilization, Spinal mobilization, Cryotherapy, and Moist heat.  PLAN FOR NEXT SESSION: ***   Vetra Shinall E Powell-Butler, PT 07/10/2023, 3:17 PM

## 2023-07-13 ENCOUNTER — Ambulatory Visit (HOSPITAL_COMMUNITY)

## 2023-08-19 ENCOUNTER — Ambulatory Visit (HOSPITAL_COMMUNITY): Attending: Neurological Surgery

## 2023-08-19 ENCOUNTER — Encounter (HOSPITAL_COMMUNITY): Payer: Self-pay

## 2023-08-19 ENCOUNTER — Other Ambulatory Visit: Payer: Self-pay

## 2023-08-19 DIAGNOSIS — M5459 Other low back pain: Secondary | ICD-10-CM | POA: Insufficient documentation

## 2023-08-19 DIAGNOSIS — Z7409 Other reduced mobility: Secondary | ICD-10-CM | POA: Insufficient documentation

## 2023-08-19 NOTE — Therapy (Signed)
 OUTPATIENT PHYSICAL THERAPY THORACOLUMBAR EVALUATION   Patient Name: Sheri Vargas MRN: [id number] DOB:05/04/1975, 48 y.o., female Today's Date: 08/19/2023  END OF SESSION:  PT End of Session - 08/19/23 1401     Visit Number 1    Date for PT Re-Evaluation 09/30/23    Authorization Type [employer] COMM PPO    Authorization Time Period seeking auth    Authorization - Visit Number 0    Progress Note Due on Visit 10    PT Start Time 1300    PT Stop Time 1345    PT Time Calculation (min) 45 min    Activity Tolerance Patient tolerated treatment well;Patient limited by pain    Behavior During Therapy [facility] for tasks assessed/performed          Past Medical History:  Diagnosis Date   Anxiety    Chronic back pain 06/17/2012   Decreased libido 06/17/2012   Degenerative disc disease    Depression    Ectopic pregnancy, tubal    5 ectopic pregnancies   Hypothyroid 06/25/2012   TSH 5.561, will rx synthroid  25 mcg and recheck in 8 weeks   Kidney stones    Menorrhagia 06/17/2012   MI (myocardial infarction) (HCC)    Thyroid  disease    Past Surgical History:  Procedure Laterality Date   BACK SURGERY     cyst removed   CHOLECYSTECTOMY  2004   ECTOPIC PREGNANCY SURGERY     LEFT HEART CATH AND CORONARY ANGIOGRAPHY N/A 01/11/2021   Procedure: LEFT HEART CATH AND CORONARY ANGIOGRAPHY;  Surgeon: Claudene Victory ORN, MD;  Location: [facility];  Service: Cardiovascular;  Laterality: N/A;   Patient Active Problem List   Diagnosis Date Noted   Abnormal thyroid  function test 08/07/2021   Vitamin D  deficiency 07/18/2021   Class 1 obesity due to excess calories with serious comorbidity and body mass index (BMI) of 30.0 to 30.9 in adult 07/18/2021   Atherosclerosis of native coronary artery without angina pectoris 07/18/2021   Mixed hyperlipidemia 03/22/2021   NSTEMI (non-ST elevated myocardial infarction) (HCC) 01/10/2021   Current smoker 01/07/2021   Encounter for gynecological  examination with Papanicolaou smear of cervix 12/24/2020   Urinary frequency 12/24/2020   Anxiety and depression 12/24/2020   Weight gain 12/24/2020   Night sweats 12/24/2020   Peri-menopause 12/24/2020   Screening mammogram for breast cancer 12/24/2020   Screening for colorectal cancer 12/24/2020   Encounter for screening fecal occult blood testing 12/24/2020   Nasal septal perforation 05/07/2017   Rhinitis, chronic 05/07/2017   Tobacco dependence 05/07/2017   Tobacco abuse 10/06/2014   Hypokalemia 10/06/2014   Chronic low back pain 10/06/2014   Infective colitis 10/04/2014   Hypothyroidism 06/25/2012   Anxiety 06/17/2012   Chronic back pain 06/17/2012   Menorrhagia 06/17/2012   Uterine enlargement 06/17/2012   Decreased libido 06/17/2012    PCP: Shona Norleen PEDLAR, MD   REFERRING PROVIDER: Joshua Alm Hamilton, MD  REFERRING DIAG: lumbar spondylolosthesis  Rationale for Evaluation and Treatment: Rehabilitation  THERAPY DIAG:  Other low back pain  Impaired functional mobility, balance, gait, and endurance  ONSET DATE: few years  SUBJECTIVE:  SUBJECTIVE STATEMENT: Pt states she has been dealing with low back pain for a few years now. Pt feels with the more weight she has gained the more the pain has increased. Pt states she feels balance has gone down due to COVID and going through a lot, pt states she feels she has been more sedentary for the last few years and has been going through a lot.  PERTINENT HISTORY:  Gall bladder removal Heart attack in 2023 Depression  PAIN:  Are you having pain? Yes: NPRS scale: 8/10 Pain location: middle of low back Pain description: stabbing and aching Aggravating factors: standing and walking Relieving factors: sitting down and pain  medications  PRECAUTIONS: None  RED FLAGS: None   WEIGHT BEARING RESTRICTIONS: No  FALLS:  Has patient fallen in last 6 months? No  LIVING ENVIRONMENT: Lives with: lives with their spouse Lives in: House/apartment Stairs: Yes: External: 3 steps; on right going up Has following equipment at home: Single point cane and Walker - 4 wheeled  OCCUPATION: out of work  PLOF: Needs assistance with ADLs and Needs assistance with homemaking  PATIENT GOALS: decrease the pain, be able to walk and bend without increased back pain  NEXT MD VISIT: 28th of August  OBJECTIVE:  Note: Objective measures were completed at Evaluation unless otherwise noted.  DIAGNOSTIC FINDINGS:  Clinical Data: Low back pain radiating to the left leg    MRI LUMBAR SPINE WITHOUT CONTRAST    Technique:  Multiplanar and multiecho pulse sequences of the lumbar  spine were obtained without intravenous contrast.    Comparison: CT examination 02/11/2007.    Findings: S1 has transitional features.    There is no abnormality at L4-5 or above.    At L5-S1, the disc shows a small central annular rent and disc  protrusion.  This does not visibly compress the thecal sac or S1  nerve roots.    S1-2 is rudimentary and normal.    Benign-appearing lipoma of the posterior abdominal wall musculature  at the L2 level is incidentally noted.    IMPRESSION:  Transitional anatomy.    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  The findings could be related to back pain.   Provider: Ludie Lever  PATIENT SURVEYS:  Modified Oswestry: 26/50  COGNITION: Overall cognitive status: pt states she has been dealing with some brain fog but has quit a depression drug because of side effects, Cymbalta     SENSATION: Numbness and tingling in hands and feet but not all the time  POSTURE: rounded shoulders, forward head,  decreased lumbar lordosis, and flexed trunk   PALPATION: Pt demonstrates abnormal sensitivity to palpation of L2-L5 segments and to bilateral lumbar paraspinal musculature left worse than right.  LUMBAR ROM:   AROM eval  Flexion 60  Extension 10  Right lateral flexion 15, pain   Left lateral flexion 15, worse pain  Right rotation 50% available, pain   Left rotation 50% available, pain worse   (Blank rows = not tested)  LOWER EXTREMITY ROM:     Active  Right eval Left eval  Hip flexion    Hip extension    Hip abduction    Hip adduction    Hip internal rotation    Hip external rotation    Knee flexion    Knee extension    Ankle dorsiflexion    Ankle plantarflexion    Ankle  inversion    Ankle eversion     (Blank rows = not tested)  LOWER EXTREMITY MMT:    MMT Right eval Left eval  Hip flexion 3+ 3-  Hip extension 3- 3-  Hip abduction 4 3  Hip adduction    Hip internal rotation    Hip external rotation    Knee flexion    Knee extension    Ankle dorsiflexion    Ankle plantarflexion    Ankle inversion    Ankle eversion     (Blank rows = not tested)  LUMBAR SPECIAL TESTS:  Straight leg raise test: positive on LLE, back pain  FUNCTIONAL TESTS:  5 times sit to stand: 19.95 seconds 2 minute walk test: TBA SLS 08/19/23: R: 3.21 seconds L: 8 seconds   GAIT: Distance walked: 80 feet to and from treatment area Assistive device utilized: None Level of assistance: Complete Independence Comments: antalgic pattern, decreased speed and stride length bilaterally  TREATMENT DATE:  08/19/2023   Evaluation: -ROM measured, Strength assessed, HEP prescribed, pt educated on prognosis, findings, and importance of HEP compliance if given.                                                                                                                                     PATIENT EDUCATION:  Education details: Pt was educated on findings of PT evaluation, prognosis,  frequency of therapy visits and rationale, attendance policy, and HEP if given.   Person educated: Patient Education method: Explanation, Verbal cues, and Handouts Education comprehension: verbalized understanding, verbal cues required, tactile cues required, and needs further education  HOME EXERCISE PROGRAM: Access Code: [email or web address] URL: [email or web address] Date: 08/19/2023 Prepared by: Lang Ada  Exercises - Sit to Stand with Arms Crossed  - 1 x daily - 7 x weekly - 3 sets - 10 reps - Supine Bridge  - 1 x daily - 7 x weekly - 3 sets - 10 reps - 3 hold - Supine Lower Trunk Rotation  - 1 x daily - 7 x weekly - 3 sets - 10 reps - Neutral Curl Up with Straight Leg  - 1 x daily - 7 x weekly - 3 sets - 10 reps - 3 hold  ASSESSMENT:  CLINICAL IMPRESSION: Patient is a 48 y.o. female who was seen today for physical therapy evaluation and treatment for lumbar spondylolosthesis.   Patient demonstrates decreased core/LE strength, abnormal pain in low back, and impaired balance. Patient also demonstrates difficulty with ambulation during today's session with antalgic pattern, decreased stride length and velocity noted. Patient also demonstrates increased tenderness to palpation of lumbar spine and corresponding musculature. Patient requires education on role of PT, importance of increased physical activity and HEP compliance for optimal results. Patient would benefit from skilled physical therapy for decreased low back pain, increased endurance with ambulation, increased core/LE strength, and balance for improved gait quality, return to higher level of function with  ADLs, and progress towards therapy goals.   OBJECTIVE IMPAIRMENTS: Abnormal gait, decreased activity tolerance, decreased balance, decreased endurance, decreased mobility, difficulty walking, decreased ROM, decreased strength, postural dysfunction, obesity, and pain.   ACTIVITY LIMITATIONS: carrying, lifting, bending,  standing, squatting, stairs, transfers, and bed mobility  PARTICIPATION LIMITATIONS: meal prep, cleaning, laundry, driving, shopping, community activity, and yard work  PERSONAL FACTORS: Age, Fitness, Time since onset of injury/illness/exacerbation, and 1 comorbidity: hx of heart attack and depression are also affecting patient's functional outcome.   REHAB POTENTIAL: Fair co morbidities  CLINICAL DECISION MAKING: Stable/uncomplicated  EVALUATION COMPLEXITY: Low   GOALS: Goals reviewed with patient? No  SHORT TERM GOALS: Target date: 09/09/23  Pt will be independent with HEP in order to demonstrate participation in Physical Therapy POC.  Baseline: Goal status: INITIAL  2.  Pt will report at worst 6/10 pain in low back with mobility in order to demonstrate improved pain with ADLs.  Baseline:  Goal status: INITIAL  LONG TERM GOALS: Target date: 09/30/23  Pt will improve 5TSTS by at least 2.3 seconds in order to demonstrate improved functional strength to return to desired activities.  Baseline: see objective.  Goal status: INITIAL  2.  Pt will improve 2 MWT by 40 feet in order to demonstrate improved functional ambulatory capacity in community setting.  Baseline: see objective.  Goal status: INITIAL  3.  Pt will improve Modified Oswestry score by at least 6 points in order to demonstrate improved pain with functional goals and outcomes. Baseline: see objective.  Goal status: INITIAL  4.  Pt will report 4/10 pain with mobility in order to demonstrate reduced pain with ADLs lasting greater than 30 minutes.  Baseline: see objective.  Goal status: INITIAL  5. Pt will demonstrate at least a 8 second improvement in SLS bilaterally for improved balance for increased community ambulation safety.  Baseline: see objective.  Goal status: INITIAL  PLAN:  PT FREQUENCY: 1-2x/week  PT DURATION: 6 weeks  PLANNED INTERVENTIONS: 97110-Therapeutic exercises, 97530- Therapeutic  activity, [id number]- Neuromuscular re-education, 97535- Self Care, 02859- Manual therapy, [phone]- Gait training, Patient/Family education, Balance training, Stair training, Spinal mobilization, DME instructions, Cryotherapy, and Moist heat.  PLAN FOR NEXT SESSION: , progress core and proximal LE strengthening   Lang Ada, PT, DPT [facility] Office: [phone] 2:05 PM, 08/19/23   Managed Medicaid Authorization Request Treatment Start Date: 26-Aug-2023  Visit Dx Codes: M54.59; Z74.09  Functional Tool Score: Modified Oswestry: 26/50  For all possible CPT codes, reference the Planned Interventions line above.     Check all conditions that are expected to impact treatment: {Conditions expected to impact treatment:Structural or anatomic abnormalities and Unknown   If treatment provided at initial evaluation, no treatment charged due to lack of authorization.

## 2023-09-07 ENCOUNTER — Encounter (HOSPITAL_COMMUNITY): Admitting: Physical Therapy

## 2023-09-07 ENCOUNTER — Telehealth (HOSPITAL_COMMUNITY): Payer: Self-pay | Admitting: Physical Therapy

## 2023-09-07 NOTE — Telephone Encounter (Signed)
 Pt did not show for appointment.  Called and spoke to pt who states she forgot.  Reminded of next appt this week and states she will be here.   Greig KATHEE Fuse, PTA/CLT [facility] Ph: [phone]

## 2023-09-10 ENCOUNTER — Encounter (HOSPITAL_COMMUNITY)

## 2023-09-14 ENCOUNTER — Encounter (HOSPITAL_COMMUNITY)

## 2023-09-14 ENCOUNTER — Telehealth (HOSPITAL_COMMUNITY): Payer: Self-pay

## 2023-09-14 NOTE — Telephone Encounter (Signed)
 No Show #2 Called patient regarding missed appointment this date. Patient reporting she thought her appointment was the next date. Reminded of next scheduled appointment this Wednesday at 1 pm and that going forward she would need to schedule appointments one at a time per No Show policy. Patient reports understanding.   5:57 PM, 09/14/23 Rosaria Settler, PT, DPT [facility] [place]

## 2023-09-16 ENCOUNTER — Ambulatory Visit (HOSPITAL_COMMUNITY)

## 2023-09-22 ENCOUNTER — Encounter (HOSPITAL_COMMUNITY): Admitting: Physical Therapy

## 2023-09-29 ENCOUNTER — Encounter (HOSPITAL_COMMUNITY)

## 2023-09-30 ENCOUNTER — Encounter (HOSPITAL_COMMUNITY)

## 2023-10-09 ENCOUNTER — Encounter (HOSPITAL_COMMUNITY)

## 2023-10-21 ENCOUNTER — Encounter (HOSPITAL_COMMUNITY)

## 2023-11-11 ENCOUNTER — Encounter (HOSPITAL_COMMUNITY)

## 2023-12-09 ENCOUNTER — Ambulatory Visit: Admitting: Cardiovascular Disease

## 2023-12-10 ENCOUNTER — Ambulatory Visit: Admitting: Cardiovascular Disease

## 2023-12-21 ENCOUNTER — Ambulatory Visit: Admitting: Cardiology

## 2023-12-21 ENCOUNTER — Ambulatory Visit: Admitting: Cardiovascular Disease

## 2023-12-21 NOTE — Progress Notes (Deleted)
 Clinical Summary Sheri Vargas is a 48 y.o.female    1. - admit 12/2020 with troponin elevation - 12/2020 cath: minimal LAD plaque, otherwise normal coronaries. LV gram LVEF 50-55%, anterobasal and mid anterior akinesis. Question of Takostubo vs regional myocarditis - 12/2020 echo: LVEF 60-65%, There is mild hypokinesis of the   left ventricular, mid anteroseptal wall, anterior wall and inferoseptal  wall.   02/2021 cMRI: LVEF 66%, no delayed LGE, no scar Past Medical History:  Diagnosis Date   Anxiety    Chronic back pain 06/17/2012   Decreased libido 06/17/2012   Degenerative disc disease    Depression    Ectopic pregnancy, tubal    5 ectopic pregnancies   Hypothyroid 06/25/2012   TSH 5.561, will rx synthroid  25 mcg and recheck in 8 weeks   Kidney stones    Menorrhagia 06/17/2012   MI (myocardial infarction) (HCC)    Thyroid  disease      Allergies  Allergen Reactions   Latex      Current Outpatient Medications  Medication Sig Dispense Refill   ALPRAZolam  (XANAX ) 0.5 MG tablet Take 0.5 mg by mouth 3 (three) times daily as needed for anxiety.     aspirin  EC 81 MG EC tablet Take 1 tablet (81 mg total) by mouth daily. Swallow whole. 30 tablet 11   CASCARA SAGRADA PO Take 1 capsule by mouth daily.     DULoxetine (CYMBALTA) 30 MG capsule      DULoxetine (CYMBALTA) 60 MG capsule Take 60 mg by mouth 2 (two) times daily.     fluticasone (FLONASE) 50 MCG/ACT nasal spray Place 1 spray into both nostrils daily.     fluticasone (FLONASE) 50 MCG/ACT nasal spray Place into the nose.     Lactobacillus (PROBIOTIC ACIDOPHILUS PO) Take 1 tablet by mouth daily.     levothyroxine  (SYNTHROID ) 25 MCG tablet      MAGNESIUM OXIDE PO Take 1 tablet by mouth daily.     metoprolol  succinate (TOPROL  XL) 25 MG 24 hr tablet Take 1 tablet (25 mg total) by mouth at bedtime. 90 tablet 3   metoprolol  tartrate (LOPRESSOR ) 25 MG tablet      mirtazapine (REMERON) 30 MG tablet      nicotine   polacrilex (NICORETTE ) 2 MG gum Take 1 each (2 mg total) by mouth as needed for smoking cessation. 100 tablet 0   nitroGLYCERIN  (NITROSTAT ) 0.4 MG SL tablet Place 1 tablet (0.4 mg total) under the tongue every 5 (five) minutes as needed for chest pain. 25 tablet 3   oxyCODONE -acetaminophen  (PERCOCET) 10-325 MG tablet Take 1 tablet by mouth 4 (four) times daily as needed.     POTASSIUM PO Take 2 tablets by mouth daily.     rosuvastatin  (CRESTOR ) 20 MG tablet Take 1 tablet (20 mg total) by mouth daily. 30 tablet 6   Vitamin D , Ergocalciferol , (DRISDOL ) 1.25 MG (50000 UNIT) CAPS capsule Take 1 capsule (50,000 Units total) by mouth every 7 (seven) days. 12 capsule 0   No current facility-administered medications for this visit.     Past Surgical History:  Procedure Laterality Date   BACK SURGERY     cyst removed   CHOLECYSTECTOMY  2004   ECTOPIC PREGNANCY SURGERY     LEFT HEART CATH AND CORONARY ANGIOGRAPHY N/A 01/11/2021   Procedure: LEFT HEART CATH AND CORONARY ANGIOGRAPHY;  Surgeon: Claudene Victory ORN, MD;  Location: [facility];  Service: Cardiovascular;  Laterality: N/A;     Allergies  Allergen Reactions   Latex       Family History  Problem Relation Age of Onset   Diabetes Paternal Grandmother    Hypertension Father    Kidney Stones Brother    Hypertension Son    Hypertension Paternal Uncle    Hypertension Paternal Uncle      Social History Sheri Vargas reports that she has been smoking cigarettes. She has never used smokeless tobacco. Sheri Vargas reports no history of alcohol use.   Review of Systems CONSTITUTIONAL: No weight loss, fever, chills, weakness or fatigue.  HEENT: Eyes: No visual loss, blurred vision, double vision or yellow sclerae.No hearing loss, sneezing, congestion, runny nose or sore throat.  SKIN: No rash or itching.  CARDIOVASCULAR:  RESPIRATORY: No shortness of breath, cough or sputum.  GASTROINTESTINAL: No anorexia, nausea, vomiting or  diarrhea. No abdominal pain or blood.  GENITOURINARY: No burning on urination, no polyuria NEUROLOGICAL: No headache, dizziness, syncope, paralysis, ataxia, numbness or tingling in the extremities. No change in bowel or bladder control.  MUSCULOSKELETAL: No muscle, back pain, joint pain or stiffness.  LYMPHATICS: No enlarged nodes. No history of splenectomy.  PSYCHIATRIC: No history of depression or anxiety.  ENDOCRINOLOGIC: No reports of sweating, cold or heat intolerance. No polyuria or polydipsia.  SABRA   Physical Examination There were no vitals filed for this visit. There were no vitals filed for this visit.  Gen: resting comfortably, no acute distress HEENT: no scleral icterus, pupils equal round and reactive, no palptable cervical adenopathy,  CV Resp: Clear to auscultation bilaterally GI: abdomen is soft, non-tender, non-distended, normal bowel sounds, no hepatosplenomegaly MSK: extremities are warm, no edema.  Skin: warm, no rash Neuro:  no focal deficits Psych: appropriate affect   Diagnostic Studies     Assessment and Plan        Dorn PHEBE Ross, M.D., F.A.C.C.

## 2023-12-23 ENCOUNTER — Encounter: Payer: Self-pay | Admitting: Cardiology

## 2023-12-23 ENCOUNTER — Other Ambulatory Visit: Payer: Self-pay

## 2023-12-23 ENCOUNTER — Emergency Department (HOSPITAL_COMMUNITY)
Admission: EM | Admit: 2023-12-23 | Discharge: 2023-12-23 | Disposition: A | Discharge status: Home / Self Care | Attending: Emergency Medicine | Admitting: Emergency Medicine

## 2023-12-23 ENCOUNTER — Ambulatory Visit: Attending: Cardiology | Admitting: Cardiology

## 2023-12-23 ENCOUNTER — Encounter (HOSPITAL_COMMUNITY): Payer: Self-pay

## 2023-12-23 ENCOUNTER — Emergency Department (HOSPITAL_COMMUNITY)

## 2023-12-23 VITALS — BP 132/88 | HR 84 | Ht 63.0 in | Wt 216.4 lb

## 2023-12-23 DIAGNOSIS — R0789 Other chest pain: Secondary | ICD-10-CM | POA: Diagnosis not present

## 2023-12-23 DIAGNOSIS — E782 Mixed hyperlipidemia: Secondary | ICD-10-CM

## 2023-12-23 DIAGNOSIS — R079 Chest pain, unspecified: Secondary | ICD-10-CM | POA: Diagnosis present

## 2023-12-23 DIAGNOSIS — M47816 Spondylosis without myelopathy or radiculopathy, lumbar region: Secondary | ICD-10-CM | POA: Insufficient documentation

## 2023-12-23 DIAGNOSIS — R0602 Shortness of breath: Secondary | ICD-10-CM | POA: Diagnosis not present

## 2023-12-23 DIAGNOSIS — E039 Hypothyroidism, unspecified: Secondary | ICD-10-CM | POA: Diagnosis not present

## 2023-12-23 DIAGNOSIS — I2510 Atherosclerotic heart disease of native coronary artery without angina pectoris: Secondary | ICD-10-CM | POA: Insufficient documentation

## 2023-12-23 DIAGNOSIS — M5416 Radiculopathy, lumbar region: Secondary | ICD-10-CM | POA: Insufficient documentation

## 2023-12-23 DIAGNOSIS — F17210 Nicotine dependence, cigarettes, uncomplicated: Secondary | ICD-10-CM | POA: Insufficient documentation

## 2023-12-23 LAB — CBC
HCT: 43.3 % (ref 36.0–46.0)
Hemoglobin: 14.7 g/dL (ref 12.0–15.0)
MCH: 31.3 pg (ref 26.0–34.0)
MCHC: 33.9 g/dL (ref 30.0–36.0)
MCV: 92.1 fL (ref 80.0–100.0)
Platelets: 541 K/uL — ABNORMAL HIGH (ref 150–400)
RBC: 4.7 MIL/uL (ref 3.87–5.11)
RDW: 14.3 % (ref 11.5–15.5)
WBC: 14.8 K/uL — ABNORMAL HIGH (ref 4.0–10.5)
nRBC: 0 % (ref 0.0–0.2)

## 2023-12-23 LAB — COMPREHENSIVE METABOLIC PANEL WITH GFR
ALT: 31 U/L (ref 0–44)
AST: 23 U/L (ref 15–41)
Albumin: 4.3 g/dL (ref 3.5–5.0)
Alkaline Phosphatase: 75 U/L (ref 38–126)
Anion gap: 13 (ref 5–15)
BUN: 13 mg/dL (ref 6–20)
CO2: 22 mmol/L (ref 22–32)
Calcium: 10.9 mg/dL — ABNORMAL HIGH (ref 8.9–10.3)
Chloride: 104 mmol/L (ref 98–111)
Creatinine, Ser: 0.77 mg/dL (ref 0.44–1.00)
GFR, Estimated: >60 mL/min (ref >60)
Glucose, Bld: 113 mg/dL — ABNORMAL HIGH (ref 70–99)
Potassium: 3.7 mmol/L (ref 3.5–5.1)
Sodium: 139 mmol/L (ref 135–145)
Total Bilirubin: 0.2 mg/dL (ref 0.0–1.2)
Total Protein: 7.3 g/dL (ref 6.5–8.1)

## 2023-12-23 LAB — TROPONIN T, HIGH SENSITIVITY
Troponin T High Sensitivity: <15 ng/L (ref 0–19)
Troponin T High Sensitivity: <15 ng/L (ref 0–19)

## 2023-12-23 LAB — PROTIME-INR
INR: 0.9 (ref 0.8–1.2)
Prothrombin Time: 12.8 s (ref 11.4–15.2)

## 2023-12-23 LAB — LIPASE, BLOOD: Lipase: 20 U/L (ref 11–51)

## 2023-12-23 MED ORDER — MORPHINE SULFATE (PF) 4 MG/ML IV SOLN
4.0000 mg | Freq: Once | INTRAVENOUS | Status: AC
  Administered 2023-12-23: 4 mg via INTRAVENOUS
  Filled 2023-12-23: qty 1

## 2023-12-23 MED ORDER — ASPIRIN 81 MG PO CHEW
324.0000 mg | CHEWABLE_TABLET | Freq: Once | ORAL | Status: AC
  Administered 2023-12-23: 324 mg via ORAL
  Filled 2023-12-23: qty 4

## 2023-12-23 MED ORDER — IBUPROFEN 200 MG PO CAPS: 400.0000 mg | ORAL_CAPSULE | Freq: Two times a day (BID) | ORAL | Status: AC

## 2023-12-23 MED ORDER — ALBUTEROL SULFATE HFA 108 (90 BASE) MCG/ACT IN AERS
2.0000 | INHALATION_SPRAY | Freq: Once | RESPIRATORY_TRACT | Status: AC
  Administered 2023-12-23: 2 via RESPIRATORY_TRACT
  Filled 2023-12-23: qty 6.7

## 2023-12-23 MED ORDER — KETOROLAC TROMETHAMINE 15 MG/ML IJ SOLN
15.0000 mg | Freq: Once | INTRAMUSCULAR | Status: AC
  Administered 2023-12-23: 15 mg via INTRAVENOUS
  Filled 2023-12-23: qty 1

## 2023-12-23 MED ORDER — ROSUVASTATIN CALCIUM 10 MG PO TABS: 10.0000 mg | ORAL_TABLET | Freq: Every day | ORAL | 6 refills | Status: AC

## 2023-12-23 NOTE — ED Provider Notes (Signed)
 [facility]-EMERGENCY DEPT [facility] Emergency Department Provider Note MRN:  [id number]  Arrival date & time: 12/23/23     Chief Complaint   Chest Pain   History of Present Illness   Sheri Vargas is a 48 y.o. year-old female with a history of CAD presenting to the ED with chief complaint of chest pain.  Chest pain for the past 1 to 2 weeks intermittently.  Described as a sharp but sometimes dull pain originating at the left shoulder blade, radiating down the left arm and into the left side of the chest.  Feels somewhat similar to prior heart attack.  Denies dizziness or sweatiness, no nausea vomiting, no shortness of breath, no recent fever or cough.  Pain is not worse with deep breaths.  Review of Systems  A thorough review of systems was obtained and all systems are negative except as noted in the HPI and PMH.   Patient's Health History    Past Medical History:  Diagnosis Date   Anxiety    Atherosclerosis of native coronary artery without angina pectoris 07/18/2021   Chronic back pain 06/17/2012   Decreased libido 06/17/2012   Degenerative disc disease    Depression    Ectopic pregnancy, tubal    5 ectopic pregnancies   Hypothyroid 06/25/2012   TSH 5.561, will rx synthroid  25 mcg and recheck in 8 weeks   Kidney stones    Menorrhagia 06/17/2012   MI (myocardial infarction) (HCC)    Thyroid  disease     Past Surgical History:  Procedure Laterality Date   BACK SURGERY     cyst removed   CHOLECYSTECTOMY  2004   ECTOPIC PREGNANCY SURGERY     LEFT HEART CATH AND CORONARY ANGIOGRAPHY N/A 01/11/2021   Procedure: LEFT HEART CATH AND CORONARY ANGIOGRAPHY;  Surgeon: Claudene Victory ORN, MD;  Location: [facility];  Service: Cardiovascular;  Laterality: N/A;    Family History  Problem Relation Age of Onset   Diabetes Paternal Grandmother    Hypertension Father    Kidney Stones Brother    Hypertension Son    Hypertension Paternal Uncle    Hypertension Paternal Uncle      Social History   Socioeconomic History   Marital status: Married    Spouse name: Not on file   Number of children: 3   Years of education: Not on file   Highest education level: Not on file  Occupational History   Not on file  Tobacco Use   Smoking status: Every Day    Current packs/day: 1.00    Average packs/day: 1 pack/day for 6.9 years (6.9 ttl pk-yrs)    Types: Cigarettes    Start date: 2019   Smokeless tobacco: Never  Vaping Use   Vaping status: Never Used  Substance and Sexual Activity   Alcohol use: No   Drug use: No   Sexual activity: Not Currently    Birth control/protection: None, Abstinence  Other Topics Concern   Not on file  Social History Narrative   Not on file   Social Drivers of Health   Financial Resource Strain: High Risk (12/24/2020)   Overall Financial Resource Strain (CARDIA)    Difficulty of Paying Living Expenses: Very hard  Food Insecurity: No Food Insecurity (12/24/2020)   Hunger Vital Sign    Worried About Running Out of Food in the Last Year: Never true    Ran Out of Food in the Last Year: Never true  Transportation Needs: Unmet Transportation Needs (12/24/2020)  PRAPARE - Administrator, Civil Service (Medical): Yes    Lack of Transportation (Non-Medical): Yes  Physical Activity: Insufficiently Active (12/24/2020)   Exercise Vital Sign    Days of Exercise per Week: 2 days    Minutes of Exercise per Session: 10 min  Stress: Stress Concern Present (12/24/2020)   [employer] of Occupational Health - Occupational Stress Questionnaire    Feeling of Stress : Very much  Social Connections: Moderately Integrated (12/24/2020)   Social Connection and Isolation Panel    Frequency of Communication with Friends and Family: More than three times a week    Frequency of Social Gatherings with Friends and Family: Once a week    Attends Religious Services: 1 to 4 times per year    Active Member of [employer] or Organizations: No    Attends  Banker Meetings: Never    Marital Status: Married  Catering Manager Violence: Unknown (12/24/2020)   Humiliation, Afraid, Rape, and Kick questionnaire    Fear of Current or Ex-Partner: Patient declined    Emotionally Abused: Patient declined    Physically Abused: No    Sexually Abused: No     Physical Exam   Vitals:   12/23/23 0344 12/23/23 0345  BP:  134/82  Pulse: 65   Resp:  12  Temp:    SpO2:  92%    CONSTITUTIONAL: Well-appearing, NAD NEURO/PSYCH:  Alert and oriented x 3, no focal deficits EYES:  eyes equal and reactive ENT/NECK:  no LAD, no JVD CARDIO: Regular rate, well-perfused, normal S1 and S2 PULM:  CTAB no wheezing or rhonchi GI/GU:  non-distended, non-tender MSK/SPINE:  No gross deformities, no edema SKIN:  no rash, atraumatic   *Additional and/or pertinent findings included in MDM below  Diagnostic and Interventional Summary    EKG Interpretation Date/Time:  Wednesday December 23 2023 01:23:26 EST Ventricular Rate:  73 PR Interval:  159 QRS Duration:  91 QT Interval:  364 QTC Calculation: 402 R Axis:   29  Text Interpretation: Sinus rhythm Low voltage, precordial leads Abnormal R-wave progression, early transition Confirmed by Theadore Sharper [phone]) on 12/23/2023 1:33:18 AM       Labs Reviewed  CBC - Abnormal; Notable for the following components:      Result Value   WBC 14.8 (*)    Platelets 541 (*)    All other components within normal limits  COMPREHENSIVE METABOLIC PANEL WITH GFR - Abnormal; Notable for the following components:   Glucose, Bld 113 (*)    Calcium  10.9 (*)    All other components within normal limits  LIPASE, BLOOD  PROTIME-INR  TROPONIN T, HIGH SENSITIVITY  TROPONIN T, HIGH SENSITIVITY    DG Chest Port 1 View  Final Result      Medications  aspirin  chewable tablet 324 mg (324 mg Oral Given 12/23/23 0136)  ketorolac  (TORADOL ) 15 MG/ML injection 15 mg (15 mg Intravenous Given 12/23/23 0302)  morphine  (PF)  4 MG/ML injection 4 mg (4 mg Intravenous Given 12/23/23 0302)  albuterol (VENTOLIN HFA) 108 (90 Base) MCG/ACT inhaler 2 puff (2 puffs Inhalation Given 12/23/23 0344)     Procedures  /  Critical Care Procedures  ED Course and Medical Decision Making  Initial Impression and Ddx Initial concern would be ACS given patient's history of CAD and MI.  She is in no acute distress with reassuring vital signs.  Her pain is a bit atypical, sharp, originating in the shoulder blade.  Not pleuritic, no  hypoxia or shortness of breath, doubt PE.  Overall doubt dissection as well.  Could be MSK, no abdominal pain, doubt referred splenic pain.  Past medical/surgical history that increases complexity of ED encounter:  CAD  Interpretation of Diagnostics I personally reviewed the EKG and my interpretation is as follows:  SR without ischemic findings  Labs reassuring, trop negative x 2  Patient Reassessment and Ultimate Disposition/Management     Patient feeling better after toradol  and inahler.  Favoring non cardiac pain, discharged home.  Has cards follow up later today.  Patient management required discussion with the following services or consulting groups:  None  Complexity of Problems Addressed Acute illness or injury that poses threat of life of bodily function  Additional Data Reviewed and Analyzed Further history obtained from: Prior labs/imaging results  Additional Factors Impacting ED Encounter Risk Consideration of hospitalization  Ozell HERO. Theadore, MD [facility] Emergency Medicine [facility] [email or web address]  Final Clinical Impressions(s) / ED Diagnoses     ICD-10-CM   1. Chest pain, unspecified type  R07.9       ED Discharge Orders     None        Discharge Instructions Discussed with and Provided to Patient:     Discharge Instructions      You were evaluated in the Emergency Department and after careful evaluation, we did not find any emergent  condition requiring admission or further testing in the hospital.  Your exam/testing today was overall reassuring.  Keep your follow up later today with cardiology.  Please return to the Emergency Department if you experience any worsening of your condition.  Thank you for allowing us  to be a part of your care.        Theadore Ozell HERO, MD 12/23/23 [phone]

## 2023-12-23 NOTE — ED Triage Notes (Signed)
 Pt to ED with c/o chest pain that radiates to left shoulder blade, also has sob with exertion. Symptoms started last night

## 2023-12-23 NOTE — Patient Instructions (Addendum)
 Medication Instructions:   Decrease Crestor  to 10mg  daily Ibuprofen  400mg  twice a day  x 10 days - may buy OTC Continue all other medications.     Labwork:  none  Testing/Procedures:  Your physician has requested that you have an echocardiogram. Echocardiography is a painless test that uses sound waves to create images of your heart. It provides your doctor with information about the size and shape of your heart and how well your heart's chambers and valves are working. This procedure takes approximately one hour. There are no restrictions for this procedure. Please do NOT wear cologne, perfume, aftershave, or lotions (deodorant is allowed). Please arrive 15 minutes prior to your appointment time.  Please note: We ask at that you not bring children with you during ultrasound (echo/ vascular) testing. Due to room size and safety concerns, children are not allowed in the ultrasound rooms during exams. Our front office staff cannot provide observation of children in our lobby area while testing is being conducted. An adult accompanying a patient to their appointment will only be allowed in the ultrasound room at the discretion of the ultrasound technician under special circumstances. We apologize for any inconvenience. Office will contact with results via phone, letter or mychart.     Follow-Up:  3 months   Any Other Special Instructions Will Be Listed Below (If Applicable).   If you need a refill on your cardiac medications before your next appointment, please call your pharmacy.

## 2023-12-23 NOTE — Discharge Instructions (Signed)
 You were evaluated in the Emergency Department and after careful evaluation, we did not find any emergent condition requiring admission or further testing in the hospital.  Your exam/testing today was overall reassuring.  Keep your follow up later today with cardiology.  Please return to the Emergency Department if you experience any worsening of your condition.  Thank you for allowing us  to be a part of your care.

## 2023-12-23 NOTE — Progress Notes (Signed)
 Clinical Summary Sheri Vargas is a 48 y.o.female seen today for follow up of the following medical problems. Prevoiusly followed by Dr Court, this is our first visit together.   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 LV gram LVEF 50-55%, anterobasal and mid anterior akinesis. Question of Takostubo vs regional myocarditis - 12/2020 echo: LVEF 60-65%, There is mild hypokinesis of the   left ventricular, mid anteroseptal wall, anterior wall and inferoseptal  wall.    02/2021 cMRI: LVEF 66%, no delayed LGE, no scar  - ER visit earlier this morning with chest pain - trop neg x 2. EKG NSR, no ischemic changes   - chest pain started about 3 weeks. Left shoulder into midchest. Can be sharp, at times a tightness. 6/10 in severity. Some SOB. Can occur at rest or with activity. With laying flat not severe. Nothing makes worst. Typically start around 6pm, consntant up until 10pm to 11pm.  - has percocet for back pain.      2. LE edema/DOE - reports recent diffuse edema of hand and legs - pcp started diuretic, has had some improvement -with the edema she reports some progression of her chronic SOB/DOE    3.HLD - muscle aches on crestor , she has stopped taking the 20mg  daily - muscle aches lovastatin in the past Past Medical History:  Diagnosis Date   Anxiety    Atherosclerosis of native coronary artery without angina pectoris 07/18/2021   Chronic back pain 06/17/2012   Decreased libido 06/17/2012   Degenerative disc disease    Depression    Ectopic pregnancy, tubal    5 ectopic pregnancies   Hypothyroid 06/25/2012   TSH 5.561, will rx synthroid  25 mcg and recheck in 8 weeks   Kidney stones    Menorrhagia 06/17/2012   MI (myocardial infarction) (HCC)    Thyroid  disease      Allergies  Allergen Reactions   Latex      Current Outpatient Medications  Medication Sig Dispense Refill    ALPRAZolam  (XANAX ) 0.5 MG tablet Take 0.5 mg by mouth 3 (three) times daily as needed for anxiety.     aspirin  EC 81 MG EC tablet Take 1 tablet (81 mg total) by mouth daily. Swallow whole. 30 tablet 11   CASCARA SAGRADA PO Take 1 capsule by mouth daily.     DULoxetine (CYMBALTA) 30 MG capsule      DULoxetine (CYMBALTA) 60 MG capsule Take 60 mg by mouth 2 (two) times daily.     fluticasone (FLONASE) 50 MCG/ACT nasal spray Place 1 spray into both nostrils daily.     fluticasone (FLONASE) 50 MCG/ACT nasal spray Place into the nose.     Lactobacillus (PROBIOTIC ACIDOPHILUS PO) Take 1 tablet by mouth daily.     levothyroxine  (SYNTHROID ) 25 MCG tablet      MAGNESIUM OXIDE PO Take 1 tablet by mouth daily.     metoprolol  succinate (TOPROL  XL) 25 MG 24 hr tablet Take 1 tablet (25 mg total) by mouth at bedtime. 90 tablet 3   metoprolol  tartrate (LOPRESSOR ) 25 MG tablet      mirtazapine (REMERON) 30 MG tablet      nicotine  polacrilex (NICORETTE ) 2 MG gum Take 1 each (2 mg total) by mouth as needed for smoking cessation. 100 tablet 0   nitroGLYCERIN  (NITROSTAT ) 0.4 MG SL tablet Place 1 tablet (0.4 mg total) under the tongue every 5 (five) minutes  as needed for chest pain. 25 tablet 3   oxyCODONE -acetaminophen  (PERCOCET) 10-325 MG tablet Take 1 tablet by mouth 4 (four) times daily as needed.     POTASSIUM PO Take 2 tablets by mouth daily.     rosuvastatin  (CRESTOR ) 20 MG tablet Take 1 tablet (20 mg total) by mouth daily. 30 tablet 6   Vitamin D , Ergocalciferol , (DRISDOL ) 1.25 MG (50000 UNIT) CAPS capsule Take 1 capsule (50,000 Units total) by mouth every 7 (seven) days. 12 capsule 0   No current facility-administered medications for this visit.     Past Surgical History:  Procedure Laterality Date   BACK SURGERY     cyst removed   CHOLECYSTECTOMY  2004   ECTOPIC PREGNANCY SURGERY     LEFT HEART CATH AND CORONARY ANGIOGRAPHY N/A 01/11/2021   Procedure: LEFT HEART CATH AND CORONARY ANGIOGRAPHY;   Surgeon: Claudene Victory ORN, MD;  Location: [facility];  Service: Cardiovascular;  Laterality: N/A;     Allergies  Allergen Reactions   Latex       Family History  Problem Relation Age of Onset   Diabetes Paternal Grandmother    Hypertension Father    Kidney Stones Brother    Hypertension Son    Hypertension Paternal Uncle    Hypertension Paternal Uncle      Social History Ms. Hepner reports that she has been smoking cigarettes. She started smoking about 6 years ago. She has a 6.9 pack-year smoking history. She has never used smokeless tobacco. Ms. Jepsen reports no history of alcohol use.   Physical Examination Today's Vitals   12/23/23 1306  BP: 132/88  Pulse: 84  SpO2: 96%  Weight: 216 lb 6.4 oz (98.2 kg)  Height: 5' 3 (1.6 m)   Body mass index is 38.33 kg/m.  Gen: resting comfortably, no acute distress HEENT: no scleral icterus, pupils equal round and reactive, no palptable cervical adenopathy,  CV: RRR, no m/rg, no jvd Resp: Clear to auscultation bilaterally GI: abdomen is soft, non-tender, non-distended, normal bowel sounds, no hepatosplenomegaly MSK: extremities are warm, no edema.  Skin: warm, no rash Neuro:  no focal deficits Psych: appropriate affect     Assessment and Plan  1.Chest pain - noncardiac chest pain. Left shoulder into chest lasting hours at a time, better with laying flat. Area tender to palpation. ER workup with EKG and enzymes were benign  - no ischemic testing is indicated. MSK pain, recommended 400mg  bid of ibuprofen  x 10 days  2. SOB/LE edema - euvolemic today however reports recent edema that improved with diuretic by pcp. Atypical swelling for cardiac etiology given involved the hands and feet, but did have some related DOE - will plan for echo  3.HLD - muscle aches on crestor  20mg  daily, she stopped taking. Will try taking 20mg  daily   F/u 3 months      Sheri Vargas, M.D.

## 2024-01-05 ENCOUNTER — Ambulatory Visit: Attending: Cardiology

## 2024-01-05 DIAGNOSIS — R0602 Shortness of breath: Secondary | ICD-10-CM

## 2024-01-05 LAB — ECHOCARDIOGRAM COMPLETE
AR max vel: 2.87 cm2
AV Peak grad: 6.5 mmHg
Ao pk vel: 1.27 m/s
Area-P 1/2: 3.28 cm2
Calc EF: 61.5 %
S' Lateral: 2.5 cm
Single Plane A2C EF: 62.1 %
Single Plane A4C EF: 61.1 %

## 2024-02-09 ENCOUNTER — Ambulatory Visit: Payer: Self-pay | Admitting: Cardiology

## 2024-02-23 ENCOUNTER — Ambulatory Visit: Admitting: Cardiology

## 2024-03-22 ENCOUNTER — Ambulatory Visit: Admitting: Nurse Practitioner
